# Patient Record
Sex: Female | Born: 1965 | Race: White | Hispanic: No | Smoking: Never smoker
Health system: Southern US, Community
[De-identification: ages and names within clinical notes are randomized; demographics above are authoritative.]

## PROBLEM LIST (undated history)

## (undated) DIAGNOSIS — D649 Anemia, unspecified: Secondary | ICD-10-CM

## (undated) DIAGNOSIS — E538 Deficiency of other specified B group vitamins: Secondary | ICD-10-CM

## (undated) DIAGNOSIS — Z8041 Family history of malignant neoplasm of ovary: Secondary | ICD-10-CM

## (undated) HISTORY — DX: Deficiency of other specified B group vitamins: E53.8

## (undated) HISTORY — DX: Family history of malignant neoplasm of ovary: Z80.41

## (undated) HISTORY — PX: WISDOM TOOTH EXTRACTION: SHX21

## (undated) HISTORY — DX: Anemia, unspecified: D64.9

---

## 2006-01-31 ENCOUNTER — Ambulatory Visit: Payer: Self-pay | Admitting: Unknown Physician Specialty

## 2006-02-03 ENCOUNTER — Ambulatory Visit: Payer: Self-pay | Admitting: Unknown Physician Specialty

## 2006-08-07 ENCOUNTER — Ambulatory Visit: Payer: Self-pay | Admitting: Unknown Physician Specialty

## 2007-02-06 ENCOUNTER — Ambulatory Visit: Payer: Self-pay | Admitting: Unknown Physician Specialty

## 2008-03-04 ENCOUNTER — Ambulatory Visit: Payer: Self-pay | Admitting: Unknown Physician Specialty

## 2008-06-13 HISTORY — PX: BREAST BIOPSY: SHX20

## 2008-10-27 ENCOUNTER — Ambulatory Visit: Payer: Self-pay | Admitting: Unknown Physician Specialty

## 2009-04-16 ENCOUNTER — Ambulatory Visit: Payer: Self-pay | Admitting: General Surgery

## 2009-04-28 ENCOUNTER — Ambulatory Visit: Payer: Self-pay | Admitting: General Surgery

## 2009-06-13 HISTORY — PX: OTHER SURGICAL HISTORY: SHX169

## 2010-04-12 ENCOUNTER — Emergency Department: Payer: Self-pay | Admitting: Emergency Medicine

## 2010-04-13 ENCOUNTER — Ambulatory Visit: Payer: Self-pay | Admitting: Unknown Physician Specialty

## 2010-09-23 ENCOUNTER — Ambulatory Visit: Payer: Self-pay | Admitting: Unknown Physician Specialty

## 2011-01-19 LAB — HM MAMMOGRAPHY: HM Mammogram: NORMAL

## 2011-01-19 LAB — HM PAP SMEAR

## 2011-04-28 ENCOUNTER — Ambulatory Visit: Payer: Self-pay | Admitting: Unknown Physician Specialty

## 2011-11-21 ENCOUNTER — Ambulatory Visit: Payer: Self-pay | Admitting: Internal Medicine

## 2011-12-19 ENCOUNTER — Encounter: Payer: Self-pay | Admitting: Internal Medicine

## 2011-12-19 ENCOUNTER — Other Ambulatory Visit: Payer: Self-pay | Admitting: *Deleted

## 2011-12-19 ENCOUNTER — Ambulatory Visit (INDEPENDENT_AMBULATORY_CARE_PROVIDER_SITE_OTHER): Payer: Self-pay | Admitting: Internal Medicine

## 2011-12-19 VITALS — BP 112/72 | HR 76 | Temp 98.2°F | Ht 70.0 in | Wt 258.5 lb

## 2011-12-19 DIAGNOSIS — Z0000 Encounter for general adult medical examination without abnormal findings: Secondary | ICD-10-CM

## 2011-12-19 DIAGNOSIS — D510 Vitamin B12 deficiency anemia due to intrinsic factor deficiency: Secondary | ICD-10-CM | POA: Insufficient documentation

## 2011-12-19 DIAGNOSIS — Z23 Encounter for immunization: Secondary | ICD-10-CM

## 2011-12-19 DIAGNOSIS — R209 Unspecified disturbances of skin sensation: Secondary | ICD-10-CM

## 2011-12-19 DIAGNOSIS — E669 Obesity, unspecified: Secondary | ICD-10-CM

## 2011-12-19 DIAGNOSIS — R202 Paresthesia of skin: Secondary | ICD-10-CM

## 2011-12-19 DIAGNOSIS — N920 Excessive and frequent menstruation with regular cycle: Secondary | ICD-10-CM

## 2011-12-19 LAB — COMPREHENSIVE METABOLIC PANEL
AST: 20 U/L (ref 0–37)
Albumin: 3.7 g/dL (ref 3.5–5.2)
BUN: 12 mg/dL (ref 6–23)
Calcium: 9 mg/dL (ref 8.4–10.5)
Chloride: 107 mEq/L (ref 96–112)
Glucose, Bld: 95 mg/dL (ref 70–99)
Potassium: 4.1 mEq/L (ref 3.5–5.1)
Total Protein: 7.5 g/dL (ref 6.0–8.3)

## 2011-12-19 LAB — CBC WITH DIFFERENTIAL/PLATELET
Basophils Absolute: 0 10*3/uL (ref 0.0–0.1)
Basophils Relative: 0.5 % (ref 0.0–3.0)
Hemoglobin: 13.8 g/dL (ref 12.0–15.0)
Lymphocytes Relative: 28.6 % (ref 12.0–46.0)
Monocytes Relative: 4.2 % (ref 3.0–12.0)
Neutro Abs: 5.3 10*3/uL (ref 1.4–7.7)
RBC: 4.77 Mil/uL (ref 3.87–5.11)

## 2011-12-19 LAB — LIPID PANEL
LDL Cholesterol: 85 mg/dL (ref 0–99)
Total CHOL/HDL Ratio: 3
Triglycerides: 129 mg/dL (ref 0.0–149.0)

## 2011-12-19 MED ORDER — CYANOCOBALAMIN 1000 MCG/ML IJ SOLN: 1000.0000 ug | INTRAMUSCULAR | Status: DC

## 2011-12-19 MED ORDER — NORETHIN ACE-ETH ESTRAD-FE 1-20 MG-MCG PO TABS: 1.0000 | ORAL_TABLET | Freq: Every day | ORAL | Status: DC

## 2011-12-19 MED ORDER — SYRINGE (DISPOSABLE) 1 ML MISC: Status: DC

## 2011-12-19 NOTE — Assessment & Plan Note (Signed)
Given time frame for this, suspicious that may be related to epidural anesthesia procedure. We discussed setting up referral to neurology for further evaluation likely to include EMG testing. Will set this up.

## 2011-12-19 NOTE — Assessment & Plan Note (Signed)
BMI 37. Encouraged her to continue efforts at healthy diet and regular physical activity, sitting goal of 30 minutes of physical activity 5 days per week. Will check basic lab work including TSH.

## 2011-12-19 NOTE — Progress Notes (Signed)
Subjective:    Patient ID: Jessica Bennett, female    DOB: [birth year], 46 y.o.   MRN: [id number]  HPI 46 year old female with history of menorrhagia presents to establish care. She reports that she is generally feeling well. She notes that she was started on oral contraceptives to help with menorrhagia. She reports no further issues with menorrhagia while on medication.  Her primary concern today is 3 year history of paresthesias over her left lateral thigh. These occur in a very focal area. They first started after she had surgical procedure on her right foot which required epidural anesthesia. The paresthesias on her left lateral thigh has been present since that time. They're unchanged. Occasionally, she has some paresthesia that runs down her left leg. She denies any back pain. She denies any weakness in her left leg. She also notes some mild varicosities in both of her lower extremities but denies any swelling in her legs. She has not had any overlying skin changes at the site of the paresthesias.. These are described as numbness which are not painful.  She also reports concern about her weight. She notes that she has started a walking program with her daughter. She is currently trying to walk about 30 minutes per day. She is also trying to make healthier dietary choices.  Outpatient Encounter Prescriptions as of 12/19/2011  Medication Sig Dispense Refill  . calcium-vitamin D (OSCAL WITH D) 500-200 MG-UNIT per tablet Take 1 tablet by mouth daily.      . Multiple Vitamin (MULTIVITAMIN) tablet Take 1 tablet by mouth daily.      . norethindrone-ethinyl estradiol (JUNEL FE,GILDESS FE,LOESTRIN FE) 1-20 MG-MCG tablet Take 1 tablet by mouth daily.  3 Package  4  . Omega-3 Fatty Acids (FISH OIL) 1200 MG CAPS Take 2 capsules by mouth daily.      Marland Kitchen DISCONTD: norethindrone-ethinyl estradiol (JUNEL FE,GILDESS FE,LOESTRIN FE) 1-20 MG-MCG tablet Take 1 tablet by mouth daily.        Review of Systems    Constitutional: Negative for fever, chills, appetite change, fatigue and unexpected weight change.  HENT: Negative for ear pain, congestion, sore throat, trouble swallowing, neck pain, voice change and sinus pressure.   Eyes: Negative for visual disturbance.  Respiratory: Negative for cough, shortness of breath, wheezing and stridor.   Cardiovascular: Negative for chest pain, palpitations and leg swelling.  Gastrointestinal: Negative for nausea, vomiting, abdominal pain, diarrhea, constipation, blood in stool, abdominal distention and anal bleeding.  Genitourinary: Negative for dysuria and flank pain.  Musculoskeletal: Negative for myalgias, arthralgias and gait problem.  Skin: Negative for color change and rash.  Neurological: Positive for numbness. Negative for dizziness, tremors, syncope, weakness and headaches.  Hematological: Negative for adenopathy. Does not bruise/bleed easily.  Psychiatric/Behavioral: Negative for suicidal ideas, disturbed wake/sleep cycle and dysphoric mood. The patient is not nervous/anxious.    BP 112/72  Pulse 76  Temp 98.2 F (36.8 C) (Oral)  Ht 5\' 10"  (1.778 m)  Wt 258 lb 8 oz (117.255 kg)  BMI 37.09 kg/m2  SpO2 98%  LMP 11/28/2011     Objective:   Physical Exam  Constitutional: She is oriented to person, place, and time. She appears well-developed and well-nourished. No distress.  HENT:  Head: Normocephalic and atraumatic.  Right Ear: External ear normal.  Left Ear: External ear normal.  Nose: Nose normal.  Mouth/Throat: Oropharynx is clear and moist. No oropharyngeal exudate.  Eyes: Conjunctivae are normal. Pupils are equal, round, and reactive to light. Right eye exhibits  no discharge. Left eye exhibits no discharge. No scleral icterus.  Neck: Normal range of motion. Neck supple. No tracheal deviation present. No thyromegaly present.  Cardiovascular: Normal rate, regular rhythm, normal heart sounds and intact distal pulses.  Exam reveals no  gallop and no friction rub.   No murmur heard. Pulmonary/Chest: Effort normal and breath sounds normal. No respiratory distress. She has no wheezes. She has no rales. She exhibits no tenderness.  Abdominal: Soft. Bowel sounds are normal. She exhibits no distension and no mass. There is no tenderness. There is no guarding.  Musculoskeletal: Normal range of motion. She exhibits no edema and no tenderness.  Lymphadenopathy:    She has no cervical adenopathy.  Neurological: She is alert and oriented to person, place, and time. She displays no atrophy. No cranial nerve deficit or sensory deficit. She exhibits normal muscle tone. Coordination and gait normal.  Skin: Skin is warm and dry. No rash noted. She is not diaphoretic. No erythema. No pallor.  Psychiatric: She has a normal mood and affect. Her behavior is normal. Judgment and thought content normal.          Assessment & Plan:

## 2011-12-19 NOTE — Assessment & Plan Note (Signed)
History of pernicious anemia in the past. Will check CBC and B12 level with labs today.

## 2011-12-19 NOTE — Assessment & Plan Note (Signed)
Symptoms well controlled with use of oral contraceptives. Will continue.

## 2012-01-19 ENCOUNTER — Encounter: Payer: Self-pay | Admitting: Internal Medicine

## 2012-01-19 ENCOUNTER — Ambulatory Visit (INDEPENDENT_AMBULATORY_CARE_PROVIDER_SITE_OTHER): Payer: BC Managed Care – PPO | Admitting: Internal Medicine

## 2012-01-19 VITALS — BP 142/72 | HR 80 | Temp 98.1°F | Ht 70.0 in | Wt 260.8 lb

## 2012-01-19 DIAGNOSIS — R209 Unspecified disturbances of skin sensation: Secondary | ICD-10-CM

## 2012-01-19 DIAGNOSIS — E669 Obesity, unspecified: Secondary | ICD-10-CM

## 2012-01-19 DIAGNOSIS — D510 Vitamin B12 deficiency anemia due to intrinsic factor deficiency: Secondary | ICD-10-CM

## 2012-01-19 DIAGNOSIS — R202 Paresthesia of skin: Secondary | ICD-10-CM

## 2012-01-19 MED ORDER — CYANOCOBALAMIN 1000 MCG/ML IJ SOLN
1000.0000 ug | Freq: Once | INTRAMUSCULAR | Status: AC
  Administered 2012-01-19: 1000 ug via INTRAMUSCULAR

## 2012-01-19 NOTE — Assessment & Plan Note (Signed)
No improvement with B12 supplementation. Awaiting neurology evaluation with EMG testing.

## 2012-01-19 NOTE — Assessment & Plan Note (Signed)
Encouraged better compliance with healthy diet and regular physical activity. Followup for physical exam in April 2014.

## 2012-01-19 NOTE — Progress Notes (Signed)
Subjective:    Patient ID: Jessica Bennett, female    DOB: 02/01/1966, 46 y.o.   MRN: [id number]  HPI 46 YO female with history of paresthesias of her left leg and obesity presents for followup. She reports she is generally been feeling well. She has not had any improvement in the paresthesias in her left leg. She notes that neurology evaluation is still pending. On her last visit, she was noted have low B12 level and was given B12 injection. She reports no difference in the paresthesias with this intervention.  In regards to her obesity, she reports some ongoing dietary indiscretion. She does not follow any specific diet or exercise program  Outpatient Encounter Prescriptions as of 01/19/2012  Medication Sig Dispense Refill  . calcium-vitamin D (OSCAL WITH D) 500-200 MG-UNIT per tablet Take 1 tablet by mouth daily.      . cyanocobalamin (,VITAMIN B-12,) 1000 MCG/ML injection Inject 1 mL (1,000 mcg total) into the muscle every 30 (thirty) days.  30 mL  11  . Multiple Vitamin (MULTIVITAMIN) tablet Take 1 tablet by mouth daily.      . norethindrone-ethinyl estradiol (JUNEL FE,GILDESS FE,LOESTRIN FE) 1-20 MG-MCG tablet Take 1 tablet by mouth daily.  3 Package  4  . Omega-3 Fatty Acids (FISH OIL) 1200 MG CAPS Take 2 capsules by mouth daily.      . Syringe, Disposable, 1 ML MISC Use to administer monthly B12 injections  100 each  11   Facility-Administered Encounter Medications as of 01/19/2012  Medication Dose Route Frequency Provider Last Rate Last Dose  . cyanocobalamin ((VITAMIN B-12)) injection 1,000 mcg  1,000 mcg Intramuscular Once Shelia Media, MD   1,000 mcg at 01/19/12 1026    Review of Systems  Constitutional: Negative for fever, chills, appetite change, fatigue and unexpected weight change.  HENT: Negative for ear pain, congestion, sore throat, trouble swallowing, neck pain, voice change and sinus pressure.   Eyes: Negative for visual disturbance.  Respiratory: Negative for cough,  shortness of breath, wheezing and stridor.   Cardiovascular: Negative for chest pain, palpitations and leg swelling.  Gastrointestinal: Negative for nausea, vomiting, abdominal pain, diarrhea, constipation, blood in stool, abdominal distention and anal bleeding.  Genitourinary: Negative for dysuria and flank pain.  Musculoskeletal: Negative for myalgias, arthralgias and gait problem.  Skin: Negative for color change and rash.  Neurological: Positive for numbness. Negative for dizziness and headaches.  Hematological: Negative for adenopathy. Does not bruise/bleed easily.  Psychiatric/Behavioral: Negative for suicidal ideas, disturbed wake/sleep cycle and dysphoric mood. The patient is not nervous/anxious.    BP 142/72  Pulse 80  Temp 98.1 F (36.7 C) (Oral)  Ht 5\' 10"  (1.778 m)  Wt 260 lb 12 oz (118.275 kg)  BMI 37.41 kg/m2  SpO2 98%  LMP 12/29/2011     Objective:   Physical Exam  Constitutional: She is oriented to person, place, and time. She appears well-developed and well-nourished. No distress.  HENT:  Head: Normocephalic and atraumatic.  Right Ear: External ear normal.  Left Ear: External ear normal.  Nose: Nose normal.  Mouth/Throat: Oropharynx is clear and moist. No oropharyngeal exudate.  Eyes: Conjunctivae are normal. Pupils are equal, round, and reactive to light. Right eye exhibits no discharge. Left eye exhibits no discharge. No scleral icterus.  Neck: Normal range of motion. Neck supple. No tracheal deviation present. No thyromegaly present.  Cardiovascular: Normal rate, regular rhythm, normal heart sounds and intact distal pulses.  Exam reveals no gallop and no friction rub.  No murmur heard. Pulmonary/Chest: Effort normal and breath sounds normal. No respiratory distress. She has no wheezes. She has no rales. She exhibits no tenderness.  Musculoskeletal: Normal range of motion. She exhibits no edema and no tenderness.  Lymphadenopathy:    She has no cervical  adenopathy.  Neurological: She is alert and oriented to person, place, and time. No cranial nerve deficit. She exhibits normal muscle tone. Coordination normal.  Skin: Skin is warm and dry. No rash noted. She is not diaphoretic. No erythema. No pallor.  Psychiatric: She has a normal mood and affect. Her behavior is normal. Judgment and thought content normal.          Assessment & Plan:

## 2012-01-19 NOTE — Assessment & Plan Note (Signed)
B12 shot today. We'll plan to give B12 shots monthly.

## 2012-02-27 ENCOUNTER — Encounter: Payer: Self-pay | Admitting: Internal Medicine

## 2012-04-04 ENCOUNTER — Encounter: Payer: Self-pay | Admitting: Internal Medicine

## 2012-04-04 DIAGNOSIS — Z1239 Encounter for other screening for malignant neoplasm of breast: Secondary | ICD-10-CM

## 2012-04-13 ENCOUNTER — Telehealth: Payer: Self-pay | Admitting: Internal Medicine

## 2012-04-13 DIAGNOSIS — Z1239 Encounter for other screening for malignant neoplasm of breast: Secondary | ICD-10-CM

## 2012-04-13 NOTE — Telephone Encounter (Signed)
Order placed

## 2012-04-13 NOTE — Telephone Encounter (Signed)
Pt is calling concerning Mammo. She says she received a letter stating it was time to make her appt and she was wondering if an order could be put in so she could make that appt.

## 2012-04-13 NOTE — Telephone Encounter (Signed)
Jessica Bennett please call patient to set up mammogram, order placed.

## 2012-04-23 ENCOUNTER — Telehealth: Payer: Self-pay | Admitting: Internal Medicine

## 2012-04-23 NOTE — Telephone Encounter (Signed)
Caller: [name]/Patient; Patient Name: Jessica Bennett; PCP: Ronna Polio (Adults only); Best Callback Phone Number: [phone]  Health Hx verified per EPIC. Calling about ongoing discomfort in left upper arm extending to shoulder and elbow. Onset approx. several months Initially seemed to be a localized knot that now seems to be diffuse. Constant sensation - not aware of anything that makes it worse other than laying on it. All emergent sxs per Arm Non-Injury protocol r/o with exception to 'Gradual onset of episodes of pain shooting down any extremity.'

## 2012-04-23 NOTE — Telephone Encounter (Signed)
Appt scheduled with Dr. Lorin Picket 04/24/12 at 0815.

## 2012-04-24 ENCOUNTER — Ambulatory Visit (INDEPENDENT_AMBULATORY_CARE_PROVIDER_SITE_OTHER): Payer: BC Managed Care – PPO | Admitting: Internal Medicine

## 2012-04-24 ENCOUNTER — Encounter: Payer: Self-pay | Admitting: Internal Medicine

## 2012-04-24 ENCOUNTER — Ambulatory Visit (INDEPENDENT_AMBULATORY_CARE_PROVIDER_SITE_OTHER)
Admission: RE | Admit: 2012-04-24 | Discharge: 2012-04-24 | Disposition: A | Discharge status: Home / Self Care | Payer: BC Managed Care – PPO | Source: Ambulatory Visit | Attending: Internal Medicine | Admitting: Internal Medicine

## 2012-04-24 VITALS — BP 117/76 | HR 87 | Temp 98.2°F | Ht 70.0 in | Wt 258.0 lb

## 2012-04-24 DIAGNOSIS — M25519 Pain in unspecified shoulder: Secondary | ICD-10-CM

## 2012-04-24 DIAGNOSIS — M79609 Pain in unspecified limb: Secondary | ICD-10-CM

## 2012-04-24 DIAGNOSIS — M79603 Pain in arm, unspecified: Secondary | ICD-10-CM

## 2012-04-24 NOTE — Patient Instructions (Addendum)
It was nice meeting you today.  I am sorry your arm has been bothering you.  We will check xray as we discussed.  Take tylenol as directed.  Let us know if problems.

## 2012-04-25 ENCOUNTER — Encounter: Payer: Self-pay | Admitting: Internal Medicine

## 2012-04-25 NOTE — Progress Notes (Signed)
  Subjective:    Patient ID: Jessica Bennett, female    DOB: Jan 29, 1966, 46 y.o.   MRN: [id number]  HPI 46 year old female who presents as a work in with concerns regarding persistent arm pain.  States she has noticed the discomfort for several months.  No injury or trauma.  Notices more recently.  She localizes the discomfort to the left upper arm.  Described as uncomfortable.  Not constant.  If she is busy - she does not notice.  Notices at night more.  Not severe pain.  She has recently noticed some discomfort in her shoulder - with full extension and reaching posteriorly.  Does not know if this is related.  No increased swelling, erythema or warmth of the arm.  She is concerned that she has a "tumor or mass" in her arm.  Constantly feeling and pressing on the area.  No pain in the elbow and no pain in the arm with movement.  Not taking anything for pain.     Review of Systems Patient denies any headache, lightheadedness or dizziness.  No chest pain, tightness or palpitations.  No increased shortness of breath.  No breast changes or nodules.   No nausea or vomiting.  No abdominal pain or cramping.  Arm pain as outlined.       Objective:   Physical Exam Filed Vitals:   04/24/12 0816  BP: 117/76  Pulse: 87  Temp: 98.2 F (79.28 C)   46 year old female in no acute distress.  NECK:  Supple, nontender.  No palpable lymphadenopathy HEART:  Appears to be regular. LUNGS:  Without crackles or wheezing audible.  Respirations even and unlabored.   RADIAL PULSE:  Equal bilaterally.  MSK.  Minimal pain to palpation - left upper arm.  No increased swelling or erythema.  No palpable lump or nodule.  Increased pain in the shoulder with full extension of the arm with posterior reaching. No axillary adenopathy appreciated (left).                     Assessment & Plan:  ARM PAIN.  Unclear as to the exact etiology.  Some pain in the shoulder with full extension and reaching posteriorly.  Reassured her  that I did not palpate a mass or nodule.  No increased swelling.  Given present for months, will start with xray of humerus and left shoulder - with detail to surrounding soft tissue.  Have her take scheduled tylenol as directed.  Further w/up pending xray results and response to tylenol.  Pt comfortable with this plan.

## 2012-04-27 ENCOUNTER — Telehealth: Payer: Self-pay | Admitting: *Deleted

## 2012-04-27 NOTE — Telephone Encounter (Signed)
Patient notified. About Xrays.

## 2012-04-30 ENCOUNTER — Ambulatory Visit: Payer: Self-pay | Admitting: Internal Medicine

## 2012-04-30 LAB — HM MAMMOGRAPHY

## 2012-05-09 ENCOUNTER — Encounter: Payer: Self-pay | Admitting: Internal Medicine

## 2012-05-09 ENCOUNTER — Ambulatory Visit (INDEPENDENT_AMBULATORY_CARE_PROVIDER_SITE_OTHER): Payer: BC Managed Care – PPO | Admitting: Internal Medicine

## 2012-05-09 VITALS — BP 116/84 | HR 83 | Temp 98.1°F | Resp 15 | Wt 256.0 lb

## 2012-05-09 DIAGNOSIS — M79622 Pain in left upper arm: Secondary | ICD-10-CM | POA: Insufficient documentation

## 2012-05-09 DIAGNOSIS — M79609 Pain in unspecified limb: Secondary | ICD-10-CM

## 2012-05-09 DIAGNOSIS — D510 Vitamin B12 deficiency anemia due to intrinsic factor deficiency: Secondary | ICD-10-CM

## 2012-05-09 LAB — CBC WITH DIFFERENTIAL/PLATELET
Basophils Absolute: 0 K/uL (ref 0.0–0.1)
Basophils Relative: 0.5 % (ref 0.0–3.0)
Eosinophils Absolute: 0.1 K/uL (ref 0.0–0.7)
Eosinophils Relative: 1.1 % (ref 0.0–5.0)
HCT: 41.2 % (ref 36.0–46.0)
Hemoglobin: 13.5 g/dL (ref 12.0–15.0)
Lymphocytes Relative: 26.9 % (ref 12.0–46.0)
Lymphs Abs: 2.1 K/uL (ref 0.7–4.0)
MCHC: 32.8 g/dL (ref 30.0–36.0)
MCV: 88.1 fl (ref 78.0–100.0)
Monocytes Absolute: 0.4 K/uL (ref 0.1–1.0)
Monocytes Relative: 5.6 % (ref 3.0–12.0)
Neutro Abs: 5.1 K/uL (ref 1.4–7.7)
Neutrophils Relative %: 65.9 % (ref 43.0–77.0)
Platelets: 303 K/uL (ref 150.0–400.0)
RBC: 4.67 Mil/uL (ref 3.87–5.11)
RDW: 12.6 % (ref 11.5–14.6)
WBC: 7.7 K/uL (ref 4.5–10.5)

## 2012-05-09 LAB — COMPREHENSIVE METABOLIC PANEL
ALT: 18 U/L (ref 0–35)
CO2: 26 mEq/L (ref 19–32)
Creatinine, Ser: 0.8 mg/dL (ref 0.4–1.2)
GFR: 83.05 mL/min (ref >60.00)
Total Bilirubin: 0.6 mg/dL (ref 0.3–1.2)

## 2012-05-09 LAB — VITAMIN B12: Vitamin B-12: 489 pg/mL (ref 211–911)

## 2012-05-09 NOTE — Assessment & Plan Note (Addendum)
Symptoms of left upper medial arm pain unclear etiology. Exam is normal. Plain film of left shoulder and humerus were normal. Given pain with abduction of the arm, question if she may have a rotator cuff injury. Will set up orthopedics for evaluation. Question if MRI of the left shoulder or left humerus would be helpful. Followup one month.

## 2012-05-09 NOTE — Progress Notes (Signed)
Subjective:    Patient ID: Jessica Bennett, female    DOB: 09/12/1965, 46 y.o.   MRN: [id number]  HPI 46 year old female presents for acute visit complaining of several week history of left upper arm pain. Pain first began a few weeks ago. It started in the middle of her left upper arm, described as a sharp intermittent pain. She denies any trauma to this area. She denies any swelling or overlying skin changes. Over the last couple weeks the pain has started to occasionally radiate to her left shoulder and left elbow. It is intermittent and does not seem to be affected by movement or exertion. She has noticed some pain with abduction of her left arm and posterior extension of her left arm. She denies weakness in her arm.  At her last visit, she had plain x-rays performed of her left shoulder and left humerus which were normal. She is not currently taking any medication for pain.   Outpatient Encounter Prescriptions as of 05/09/2012  Medication Sig Dispense Refill  . calcium-vitamin D (OSCAL WITH D) 500-200 MG-UNIT per tablet Take 1 tablet by mouth daily.      . cyanocobalamin (,VITAMIN B-12,) 1000 MCG/ML injection Inject 1 mL (1,000 mcg total) into the muscle every 30 (thirty) days.  30 mL  11  . Multiple Vitamin (MULTIVITAMIN) tablet Take 1 tablet by mouth daily.      . norethindrone-ethinyl estradiol (JUNEL FE,GILDESS FE,LOESTRIN FE) 1-20 MG-MCG tablet Take 1 tablet by mouth daily.  3 Package  4  . Omega-3 Fatty Acids (FISH OIL) 1200 MG CAPS Take 2 capsules by mouth daily.      . Syringe, Disposable, 1 ML MISC Use to administer monthly B12 injections  100 each  11   BP 116/84  Pulse 83  Temp 98.1 F (36.7 C) (Oral)  Resp 15  Wt 256 lb (116.121 kg)  SpO2 97%  LMP 04/16/2012  Review of Systems  Constitutional: Negative for fever, chills, appetite change, fatigue and unexpected weight change.  HENT: Negative for ear pain, congestion, sore throat, trouble swallowing, neck pain, voice change  and sinus pressure.   Eyes: Negative for visual disturbance.  Respiratory: Negative for cough, shortness of breath, wheezing and stridor.   Cardiovascular: Negative for chest pain, palpitations and leg swelling.  Gastrointestinal: Negative for nausea, vomiting, abdominal pain, diarrhea, constipation, blood in stool, abdominal distention and anal bleeding.  Genitourinary: Negative for dysuria and flank pain.  Musculoskeletal: Positive for myalgias. Negative for arthralgias and gait problem.  Skin: Negative for color change and rash.  Neurological: Negative for dizziness and headaches.  Hematological: Negative for adenopathy. Does not bruise/bleed easily.  Psychiatric/Behavioral: Negative for suicidal ideas, sleep disturbance and dysphoric mood. The patient is not nervous/anxious.        Objective:   Physical Exam  Constitutional: She is oriented to person, place, and time. She appears well-developed and well-nourished. No distress.  HENT:  Head: Normocephalic and atraumatic.  Right Ear: External ear normal.  Left Ear: External ear normal.  Nose: Nose normal.  Mouth/Throat: Oropharynx is clear and moist. No oropharyngeal exudate.  Eyes: Conjunctivae normal are normal. Pupils are equal, round, and reactive to light. Right eye exhibits no discharge. Left eye exhibits no discharge. No scleral icterus.  Neck: Normal range of motion. Neck supple. No tracheal deviation present. No thyromegaly present.  Pulmonary/Chest: Effort normal.  Musculoskeletal: Normal range of motion. She exhibits no edema and no tenderness.       Right shoulder: She exhibits  pain. She exhibits normal range of motion, no tenderness and normal strength.       Arms: Lymphadenopathy:    She has no cervical adenopathy.  Neurological: She is alert and oriented to person, place, and time. No cranial nerve deficit. She exhibits normal muscle tone. Coordination normal.  Skin: Skin is warm and dry. No rash noted. She is not  diaphoretic. No erythema. No pallor.  Psychiatric: She has a normal mood and affect. Her behavior is normal. Judgment and thought content normal.          Assessment & Plan:

## 2012-05-14 ENCOUNTER — Encounter: Payer: Self-pay | Admitting: Internal Medicine

## 2012-05-24 ENCOUNTER — Telehealth: Payer: Self-pay

## 2012-05-24 NOTE — Telephone Encounter (Signed)
Pt request CD of recent xrays ordered by Dr Dan Humphreys to be picked up on 05/28/12. Pt to see orthopedic. Pt request call back.

## 2012-06-08 ENCOUNTER — Ambulatory Visit: Payer: BC Managed Care – PPO | Admitting: Internal Medicine

## 2012-07-25 ENCOUNTER — Ambulatory Visit: Payer: Self-pay | Admitting: Unknown Physician Specialty

## 2012-10-22 ENCOUNTER — Ambulatory Visit (INDEPENDENT_AMBULATORY_CARE_PROVIDER_SITE_OTHER): Payer: BC Managed Care – PPO | Admitting: Internal Medicine

## 2012-10-22 ENCOUNTER — Encounter: Payer: Self-pay | Admitting: Internal Medicine

## 2012-10-22 ENCOUNTER — Other Ambulatory Visit (HOSPITAL_COMMUNITY)
Admission: RE | Admit: 2012-10-22 | Discharge: 2012-10-22 | Disposition: A | Discharge status: Home / Self Care | Payer: BC Managed Care – PPO | Source: Ambulatory Visit | Attending: Internal Medicine | Admitting: Internal Medicine

## 2012-10-22 VITALS — BP 116/80 | HR 76 | Temp 98.7°F | Ht 70.25 in | Wt 253.0 lb

## 2012-10-22 DIAGNOSIS — N920 Excessive and frequent menstruation with regular cycle: Secondary | ICD-10-CM

## 2012-10-22 DIAGNOSIS — Z1151 Encounter for screening for human papillomavirus (HPV): Secondary | ICD-10-CM | POA: Insufficient documentation

## 2012-10-22 DIAGNOSIS — Z01419 Encounter for gynecological examination (general) (routine) without abnormal findings: Secondary | ICD-10-CM | POA: Insufficient documentation

## 2012-10-22 DIAGNOSIS — E669 Obesity, unspecified: Secondary | ICD-10-CM

## 2012-10-22 DIAGNOSIS — Z0000 Encounter for general adult medical examination without abnormal findings: Secondary | ICD-10-CM

## 2012-10-22 LAB — COMPREHENSIVE METABOLIC PANEL
ALT: 20 U/L (ref 0–35)
AST: 21 U/L (ref 0–37)
Albumin: 3.6 g/dL (ref 3.5–5.2)
Alkaline Phosphatase: 47 U/L (ref 39–117)
BUN: 12 mg/dL (ref 6–23)
Potassium: 4 mEq/L (ref 3.5–5.1)

## 2012-10-22 LAB — HM PAP SMEAR: HM Pap smear: NEGATIVE

## 2012-10-22 LAB — CBC WITH DIFFERENTIAL/PLATELET
Basophils Relative: 0.3 % (ref 0.0–3.0)
Eosinophils Absolute: 0.1 10*3/uL (ref 0.0–0.7)
Hemoglobin: 13.5 g/dL (ref 12.0–15.0)
MCHC: 34.4 g/dL (ref 30.0–36.0)
MCV: 86.1 fl (ref 78.0–100.0)
Monocytes Absolute: 0.4 10*3/uL (ref 0.1–1.0)
Neutro Abs: 4.8 10*3/uL (ref 1.4–7.7)
Neutrophils Relative %: 66.8 % (ref 43.0–77.0)
RBC: 4.55 Mil/uL (ref 3.87–5.11)
RDW: 12.7 % (ref 11.5–14.6)

## 2012-10-22 LAB — LIPID PANEL
LDL Cholesterol: 91 mg/dL (ref 0–99)
VLDL: 21.6 mg/dL (ref 0.0–40.0)

## 2012-10-22 MED ORDER — NORETHIN ACE-ETH ESTRAD-FE 1-20 MG-MCG PO TABS: 1.0000 | ORAL_TABLET | Freq: Every day | ORAL | Status: DC

## 2012-10-22 MED ORDER — CYANOCOBALAMIN 1000 MCG/ML IJ SOLN: 1000.0000 ug | INTRAMUSCULAR | Status: DC

## 2012-10-22 NOTE — Assessment & Plan Note (Signed)
Wt Readings from Last 3 Encounters:  10/22/12 253 lb (114.76 kg)  05/09/12 256 lb (116.121 kg)  04/24/12 258 lb (117.028 kg)   Encouraged continued efforts at healthy diet and regular physical activity.

## 2012-10-22 NOTE — Assessment & Plan Note (Signed)
General medical exam including breast and pelvic exam normal today. PAP is pending. Mammogram is up-to-date. Will check labs today including CBC, CMP, lipid profile, TSH, vitamin D. Encouraged continued efforts at healthy diet and regular physical activity.

## 2012-10-22 NOTE — Progress Notes (Signed)
Subjective:    Patient ID: Jessica Bennett, female    DOB: [birth year], 47 y.o.   MRN: [id number]  HPI 47 year old female presents for annual exam. She reports she is generally feeling well. No new concerns today. Tries to follow a healthy diet and has increased her physical activity with walking.  Outpatient Encounter Prescriptions as of 10/22/2012  Medication Sig Dispense Refill  . calcium-vitamin D (OSCAL WITH D) 500-200 MG-UNIT per tablet Take 1 tablet by mouth daily.      . cyanocobalamin (,VITAMIN B-12,) 1000 MCG/ML injection Inject 1 mL (1,000 mcg total) into the muscle every 30 (thirty) days.  30 mL  11  . Multiple Vitamin (MULTIVITAMIN) tablet Take 1 tablet by mouth daily.      . norethindrone-ethinyl estradiol (JUNEL FE,GILDESS FE,LOESTRIN FE) 1-20 MG-MCG tablet Take 1 tablet by mouth daily.  3 Package  4  . Omega-3 Fatty Acids (FISH OIL) 1200 MG CAPS Take 2 capsules by mouth daily.      . Syringe, Disposable, 1 ML MISC Use to administer monthly B12 injections  100 each  11   No facility-administered encounter medications on file as of 10/22/2012.   BP 116/80  Pulse 76  Temp(Src) 98.7 F (37.1 C) (Oral)  Ht 5' 10.25" (1.784 m)  Wt 253 lb (114.76 kg)  BMI 36.06 kg/m2  SpO2 97%  LMP 10/08/2012  Review of Systems  Constitutional: Negative for fever, chills, appetite change, fatigue and unexpected weight change.  HENT: Negative for ear pain, congestion, sore throat, trouble swallowing, neck pain, voice change and sinus pressure.   Eyes: Negative for visual disturbance.  Respiratory: Negative for cough, shortness of breath, wheezing and stridor.   Cardiovascular: Negative for chest pain, palpitations and leg swelling.  Gastrointestinal: Negative for nausea, vomiting, abdominal pain, diarrhea, constipation, blood in stool, abdominal distention and anal bleeding.  Genitourinary: Negative for dysuria and flank pain.  Musculoskeletal: Negative for myalgias, arthralgias and gait  problem.  Skin: Negative for color change and rash.  Neurological: Negative for dizziness and headaches.  Hematological: Negative for adenopathy. Does not bruise/bleed easily.  Psychiatric/Behavioral: Negative for suicidal ideas, sleep disturbance and dysphoric mood. The patient is not nervous/anxious.        Objective:   Physical Exam  Constitutional: She is oriented to person, place, and time. She appears well-developed and well-nourished. No distress.  HENT:  Head: Normocephalic and atraumatic.  Right Ear: External ear normal.  Left Ear: External ear normal.  Nose: Nose normal.  Mouth/Throat: Oropharynx is clear and moist. No oropharyngeal exudate.  Eyes: Conjunctivae are normal. Pupils are equal, round, and reactive to light. Right eye exhibits no discharge. Left eye exhibits no discharge. No scleral icterus.  Neck: Normal range of motion. Neck supple. No tracheal deviation present. No thyromegaly present.  Cardiovascular: Normal rate, regular rhythm, normal heart sounds and intact distal pulses.  Exam reveals no gallop and no friction rub.   No murmur heard. Pulmonary/Chest: Effort normal and breath sounds normal. No accessory muscle usage. Not tachypneic. No respiratory distress. She has no decreased breath sounds. She has no wheezes. She has no rhonchi. She has no rales. She exhibits no tenderness.  Abdominal: Soft. Bowel sounds are normal. She exhibits no distension and no mass. There is no tenderness. There is no rebound and no guarding.  Genitourinary: Rectum normal, vagina normal and uterus normal. No breast swelling, tenderness, discharge or bleeding. Pelvic exam was performed with patient supine. There is no rash, tenderness or lesion on the  right labia. There is no rash, tenderness or lesion on the left labia. Uterus is not enlarged and not tender. Cervix exhibits no motion tenderness, no discharge and no friability. Right adnexum displays no mass, no tenderness and no fullness.  Left adnexum displays no mass, no tenderness and no fullness. No erythema or tenderness around the vagina. No vaginal discharge found.  Musculoskeletal: Normal range of motion. She exhibits no edema and no tenderness.  Lymphadenopathy:    She has no cervical adenopathy.  Neurological: She is alert and oriented to person, place, and time. No cranial nerve deficit. She exhibits normal muscle tone. Coordination normal.  Skin: Skin is warm and dry. No rash noted. She is not diaphoretic. No erythema. No pallor.  Psychiatric: She has a normal mood and affect. Her behavior is normal. Judgment and thought content normal.          Assessment & Plan:

## 2012-10-23 LAB — VITAMIN D 25 HYDROXY (VIT D DEFICIENCY, FRACTURES): Vit D, 25-Hydroxy: 51 ng/mL (ref 30–89)

## 2013-04-09 ENCOUNTER — Encounter: Payer: Self-pay | Admitting: Internal Medicine

## 2013-04-18 ENCOUNTER — Other Ambulatory Visit: Payer: Self-pay

## 2013-04-18 LAB — HM MAMMOGRAPHY: HM Mammogram: NORMAL

## 2013-05-01 ENCOUNTER — Ambulatory Visit: Payer: Self-pay | Admitting: Internal Medicine

## 2013-05-22 ENCOUNTER — Encounter: Payer: Self-pay | Admitting: Internal Medicine

## 2013-09-09 ENCOUNTER — Encounter: Payer: Self-pay | Admitting: Internal Medicine

## 2013-10-24 ENCOUNTER — Encounter: Payer: BC Managed Care – PPO | Admitting: Internal Medicine

## 2013-10-31 ENCOUNTER — Telehealth: Payer: Self-pay | Admitting: Internal Medicine

## 2013-10-31 DIAGNOSIS — N920 Excessive and frequent menstruation with regular cycle: Secondary | ICD-10-CM

## 2013-10-31 NOTE — Telephone Encounter (Signed)
Pt had to rs cpe due to testing at work.  New appt 7/6.  States her scripts will run out 6/21.  Asking if she can get refill to get her through to cpe.

## 2013-11-01 MED ORDER — NORETHIN ACE-ETH ESTRAD-FE 1-20 MG-MCG PO TABS: 1.0000 | ORAL_TABLET | Freq: Every day | ORAL | Status: DC

## 2013-11-01 NOTE — Telephone Encounter (Signed)
Pt states she just needs her birth control refilled until her appt, to [facility]. Rx sent to pharmacy by escript, pt notified.

## 2013-11-06 ENCOUNTER — Encounter: Payer: BC Managed Care – PPO | Admitting: Internal Medicine

## 2013-11-29 ENCOUNTER — Other Ambulatory Visit: Payer: Self-pay | Admitting: *Deleted

## 2013-11-29 MED ORDER — NORETHIN ACE-ETH ESTRAD-FE 1-20 MG-MCG PO TABS
1.0000 | ORAL_TABLET | Freq: Every day | ORAL | Status: DC
Start: 2013-11-29 — End: 2013-12-16

## 2013-12-16 ENCOUNTER — Encounter: Payer: Self-pay | Admitting: Internal Medicine

## 2013-12-16 ENCOUNTER — Ambulatory Visit (INDEPENDENT_AMBULATORY_CARE_PROVIDER_SITE_OTHER): Payer: BC Managed Care – PPO | Admitting: Internal Medicine

## 2013-12-16 VITALS — BP 126/76 | HR 88 | Temp 98.2°F | Ht 70.25 in | Wt 263.8 lb

## 2013-12-16 DIAGNOSIS — Z0000 Encounter for general adult medical examination without abnormal findings: Secondary | ICD-10-CM

## 2013-12-16 DIAGNOSIS — E669 Obesity, unspecified: Secondary | ICD-10-CM

## 2013-12-16 LAB — HEMOGLOBIN A1C: Hgb A1c MFr Bld: 5.5 % (ref 4.6–6.5)

## 2013-12-16 LAB — COMPREHENSIVE METABOLIC PANEL
ALK PHOS: 47 U/L (ref 39–117)
ALT: 20 U/L (ref 0–35)
AST: 22 U/L (ref 0–37)
Albumin: 3.7 g/dL (ref 3.5–5.2)
BILIRUBIN TOTAL: 0.4 mg/dL (ref 0.2–1.2)
BUN: 11 mg/dL (ref 6–23)
CO2: 24 mEq/L (ref 19–32)
Calcium: 9.2 mg/dL (ref 8.4–10.5)
Chloride: 106 mEq/L (ref 96–112)
Creatinine, Ser: 0.9 mg/dL (ref 0.4–1.2)
GFR: 71.88 mL/min (ref >60.00)
Glucose, Bld: 108 mg/dL — ABNORMAL HIGH (ref 70–99)
Potassium: 4 mEq/L (ref 3.5–5.1)
SODIUM: 139 meq/L (ref 135–145)
TOTAL PROTEIN: 6.8 g/dL (ref 6.0–8.3)

## 2013-12-16 LAB — VITAMIN B12: VITAMIN B 12: 312 pg/mL (ref 211–911)

## 2013-12-16 LAB — CBC WITH DIFFERENTIAL/PLATELET
BASOS ABS: 0 10*3/uL (ref 0.0–0.1)
Basophils Relative: 0.6 % (ref 0.0–3.0)
Eosinophils Absolute: 0.1 10*3/uL (ref 0.0–0.7)
Eosinophils Relative: 1.3 % (ref 0.0–5.0)
HEMATOCRIT: 39.5 % (ref 36.0–46.0)
Hemoglobin: 13.4 g/dL (ref 12.0–15.0)
LYMPHS ABS: 1.8 10*3/uL (ref 0.7–4.0)
Lymphocytes Relative: 22.8 % (ref 12.0–46.0)
MCHC: 33.9 g/dL (ref 30.0–36.0)
MCV: 86.3 fl (ref 78.0–100.0)
MONO ABS: 0.3 10*3/uL (ref 0.1–1.0)
Monocytes Relative: 3.9 % (ref 3.0–12.0)
Neutro Abs: 5.5 10*3/uL (ref 1.4–7.7)
Neutrophils Relative %: 71.4 % (ref 43.0–77.0)
PLATELETS: 320 10*3/uL (ref 150.0–400.0)
RBC: 4.58 Mil/uL (ref 3.87–5.11)
RDW: 12.8 % (ref 11.5–15.5)
WBC: 7.7 10*3/uL (ref 4.0–10.5)

## 2013-12-16 LAB — MICROALBUMIN / CREATININE URINE RATIO
Creatinine,U: 324.7 mg/dL
Microalb Creat Ratio: 0.3 mg/g (ref 0.0–30.0)
Microalb, Ur: 1 mg/dL (ref 0.0–1.9)

## 2013-12-16 LAB — LIPID PANEL
CHOLESTEROL: 162 mg/dL (ref 0–200)
HDL: 44.8 mg/dL (ref >39.00)
LDL CALC: 94 mg/dL (ref 0–99)
NONHDL: 117.2
Total CHOL/HDL Ratio: 4
Triglycerides: 118 mg/dL (ref 0.0–149.0)
VLDL: 23.6 mg/dL (ref 0.0–40.0)

## 2013-12-16 LAB — TSH: TSH: 1.46 u[IU]/mL (ref 0.35–4.50)

## 2013-12-16 LAB — VITAMIN D 25 HYDROXY (VIT D DEFICIENCY, FRACTURES): VITD: 37.34 ng/mL

## 2013-12-16 MED ORDER — NORETHIN ACE-ETH ESTRAD-FE 1-20 MG-MCG PO TABS: 1.0000 | ORAL_TABLET | Freq: Every day | ORAL | Status: DC

## 2013-12-16 MED ORDER — CYANOCOBALAMIN 1000 MCG/ML IJ SOLN: 1000.0000 ug | INTRAMUSCULAR | Status: DC

## 2013-12-16 NOTE — Patient Instructions (Signed)
It was nice to see you today.  You are doing well.  Continue healthy diet and set a goal of exercising 40 minutes three times per week.

## 2013-12-16 NOTE — Progress Notes (Signed)
Subjective:    Patient ID: Jessica Bennett, female    DOB: 22-Jan-1966, 48 y.o.   MRN: [id number]  HPI 48YO female presents for annual exam. Doing well. No concerns today. Trying to follow a healthy diet and exercise.   Review of Systems  Constitutional: Negative for fever, chills, appetite change, fatigue and unexpected weight change.  Eyes: Negative for visual disturbance.  Respiratory: Negative for shortness of breath.   Cardiovascular: Negative for chest pain and leg swelling.  Gastrointestinal: Negative for nausea, vomiting, abdominal pain, diarrhea, constipation and blood in stool.  Musculoskeletal: Negative for arthralgias and myalgias.  Skin: Negative for color change and rash.  Neurological: Negative for weakness.  Hematological: Negative for adenopathy. Does not bruise/bleed easily.  Psychiatric/Behavioral: Negative for dysphoric mood. The patient is not nervous/anxious.        Objective:    BP 126/76  Pulse 88  Temp(Src) 98.2 F (36.8 C) (Oral)  Ht 5' 10.25" (1.784 m)  Wt 263 lb 12 oz (119.636 kg)  BMI 37.59 kg/m2  SpO2 98%  LMP 11/25/2013 Physical Exam  Constitutional: She is oriented to person, place, and time. She appears well-developed and well-nourished. No distress.  HENT:  Head: Normocephalic and atraumatic.  Right Ear: External ear normal.  Left Ear: External ear normal.  Nose: Nose normal.  Mouth/Throat: Oropharynx is clear and moist. No oropharyngeal exudate.  Eyes: Conjunctivae are normal. Pupils are equal, round, and reactive to light. Right eye exhibits no discharge. Left eye exhibits no discharge. No scleral icterus.  Neck: Normal range of motion. Neck supple. No tracheal deviation present. No thyromegaly present.  Cardiovascular: Normal rate, regular rhythm, normal heart sounds and intact distal pulses.  Exam reveals no gallop and no friction rub.   No murmur heard. Pulmonary/Chest: Effort normal and breath sounds normal. No accessory muscle  usage. Not tachypneic. No respiratory distress. She has no decreased breath sounds. She has no wheezes. She has no rales. She exhibits no tenderness. Right breast exhibits no inverted nipple, no mass, no nipple discharge, no skin change and no tenderness. Left breast exhibits no inverted nipple, no mass, no nipple discharge, no skin change and no tenderness. Breasts are symmetrical.  Abdominal: Soft. Bowel sounds are normal. She exhibits no distension and no mass. There is no tenderness. There is no rebound and no guarding.  Musculoskeletal: Normal range of motion. She exhibits no edema and no tenderness.  Lymphadenopathy:    She has no cervical adenopathy.  Neurological: She is alert and oriented to person, place, and time. No cranial nerve deficit. She exhibits normal muscle tone. Coordination normal.  Skin: Skin is warm and dry. No rash noted. She is not diaphoretic. No erythema. No pallor.  Psychiatric: She has a normal mood and affect. Her behavior is normal. Judgment and thought content normal.          Assessment & Plan:   Problem List Items Addressed This Visit     Unprioritized   Obesity       Wt Readings from Last 3 Encounters:  12/16/13 263 lb 12 oz (119.636 kg)  10/22/12 253 lb (114.76 kg)  05/09/12 256 lb (116.121 kg)   Body mass index is 37.59 kg/(m^2). Encouraged healthy diet and exercise with goal of weight loss. Will check TSH and A1c today.    Routine general medical examination at a health care facility - Primary     General medical exam normal today including breast exam. PAP and pelvic deferred as PAP  normal 2014. Mammogram ordered. Labs today including CBC, CMP, lipids, TSH, A1c. Encouraged healthy diet and exercise.    Relevant Orders      CBC with Differential      Comprehensive metabolic panel      Lipid panel      Microalbumin / creatinine urine ratio      Vit D  25 hydroxy (rtn osteoporosis monitoring)      TSH      Hemoglobin A1c      B12        Return in about 1 year (around 12/17/2014) for Physical.

## 2013-12-16 NOTE — Progress Notes (Signed)
Pre visit review using our clinic review tool, if applicable. No additional management support is needed unless otherwise documented below in the visit note. 

## 2013-12-16 NOTE — Assessment & Plan Note (Signed)
  Wt Readings from Last 3 Encounters:  12/16/13 263 lb 12 oz (119.636 kg)  10/22/12 253 lb (114.76 kg)  05/09/12 256 lb (116.121 kg)   Body mass index is 37.59 kg/(m^2). Encouraged healthy diet and exercise with goal of weight loss. Will check TSH and A1c today.

## 2013-12-16 NOTE — Assessment & Plan Note (Signed)
General medical exam normal today including breast exam. PAP and pelvic deferred as PAP normal 2014. Mammogram ordered. Labs today including CBC, CMP, lipids, TSH, A1c. Encouraged healthy diet and exercise.

## 2014-05-07 ENCOUNTER — Ambulatory Visit: Payer: Self-pay | Admitting: Internal Medicine

## 2014-05-07 LAB — HM MAMMOGRAPHY: HM Mammogram: NEGATIVE

## 2014-05-12 ENCOUNTER — Encounter: Payer: Self-pay | Admitting: *Deleted

## 2014-05-21 ENCOUNTER — Encounter: Payer: Self-pay | Admitting: Internal Medicine

## 2015-01-14 ENCOUNTER — Encounter: Payer: Self-pay | Admitting: Internal Medicine

## 2015-01-14 ENCOUNTER — Ambulatory Visit (INDEPENDENT_AMBULATORY_CARE_PROVIDER_SITE_OTHER): Payer: BC Managed Care – PPO | Admitting: Internal Medicine

## 2015-01-14 VITALS — BP 95/68 | HR 90 | Temp 98.0°F | Ht 70.25 in | Wt 229.5 lb

## 2015-01-14 DIAGNOSIS — E669 Obesity, unspecified: Secondary | ICD-10-CM | POA: Diagnosis not present

## 2015-01-14 DIAGNOSIS — Z0000 Encounter for general adult medical examination without abnormal findings: Secondary | ICD-10-CM

## 2015-01-14 LAB — COMPREHENSIVE METABOLIC PANEL
ALT: 20 U/L (ref 0–35)
AST: 20 U/L (ref 0–37)
Albumin: 4.2 g/dL (ref 3.5–5.2)
Alkaline Phosphatase: 46 U/L (ref 39–117)
BILIRUBIN TOTAL: 0.4 mg/dL (ref 0.2–1.2)
BUN: 13 mg/dL (ref 6–23)
CALCIUM: 9.2 mg/dL (ref 8.4–10.5)
CO2: 23 mEq/L (ref 19–32)
CREATININE: 0.86 mg/dL (ref 0.40–1.20)
Chloride: 105 mEq/L (ref 96–112)
GFR: 74.45 mL/min (ref >60.00)
Glucose, Bld: 86 mg/dL (ref 70–99)
POTASSIUM: 4.2 meq/L (ref 3.5–5.1)
SODIUM: 139 meq/L (ref 135–145)
TOTAL PROTEIN: 6.9 g/dL (ref 6.0–8.3)

## 2015-01-14 LAB — CBC WITH DIFFERENTIAL/PLATELET
BASOS ABS: 0 10*3/uL (ref 0.0–0.1)
Basophils Relative: 0.5 % (ref 0.0–3.0)
Eosinophils Absolute: 0.1 10*3/uL (ref 0.0–0.7)
Eosinophils Relative: 1.3 % (ref 0.0–5.0)
HCT: 41.5 % (ref 36.0–46.0)
HEMOGLOBIN: 14 g/dL (ref 12.0–15.0)
LYMPHS PCT: 30.4 % (ref 12.0–46.0)
Lymphs Abs: 1.9 10*3/uL (ref 0.7–4.0)
MCHC: 33.8 g/dL (ref 30.0–36.0)
MCV: 87.3 fl (ref 78.0–100.0)
MONOS PCT: 4 % (ref 3.0–12.0)
Monocytes Absolute: 0.3 10*3/uL (ref 0.1–1.0)
NEUTROS ABS: 4.1 10*3/uL (ref 1.4–7.7)
Neutrophils Relative %: 63.8 % (ref 43.0–77.0)
PLATELETS: 291 10*3/uL (ref 150.0–400.0)
RBC: 4.75 Mil/uL (ref 3.87–5.11)
RDW: 12.4 % (ref 11.5–15.5)
WBC: 6.4 10*3/uL (ref 4.0–10.5)

## 2015-01-14 LAB — LIPID PANEL
Cholesterol: 149 mg/dL (ref 0–200)
HDL: 41.1 mg/dL (ref >39.00)
LDL CALC: 83 mg/dL (ref 0–99)
NONHDL: 107.94
Total CHOL/HDL Ratio: 4
Triglycerides: 127 mg/dL (ref 0.0–149.0)
VLDL: 25.4 mg/dL (ref 0.0–40.0)

## 2015-01-14 LAB — HEMOGLOBIN A1C: Hgb A1c MFr Bld: 5.3 % (ref 4.6–6.5)

## 2015-01-14 LAB — TSH: TSH: 1.51 u[IU]/mL (ref 0.35–4.50)

## 2015-01-14 LAB — VITAMIN D 25 HYDROXY (VIT D DEFICIENCY, FRACTURES): VITD: 34.24 ng/mL (ref 30.00–100.00)

## 2015-01-14 LAB — VITAMIN B12: Vitamin B-12: 345 pg/mL (ref 211–911)

## 2015-01-14 MED ORDER — SYRINGE (DISPOSABLE) 1 ML MISC: Status: DC

## 2015-01-14 MED ORDER — CYANOCOBALAMIN 1000 MCG/ML IJ SOLN: 1000.0000 ug | INTRAMUSCULAR | Status: DC

## 2015-01-14 MED ORDER — NORETHIN ACE-ETH ESTRAD-FE 1-20 MG-MCG PO TABS: 1.0000 | ORAL_TABLET | Freq: Every day | ORAL | Status: DC

## 2015-01-14 NOTE — Progress Notes (Signed)
Pre visit review using our clinic review tool, if applicable. No additional management support is needed unless otherwise documented below in the visit note. 

## 2015-01-14 NOTE — Patient Instructions (Signed)

## 2015-01-14 NOTE — Assessment & Plan Note (Signed)
Wt Readings from Last 3 Encounters:  01/14/15 229 lb 8 oz (104.101 kg)  12/16/13 263 lb 12 oz (119.636 kg)  10/22/12 253 lb (114.76 kg)   Body mass index is 32.71 kg/(m^2). Congratulated pt on weight loss. Encouraged healthy diet and exercise.

## 2015-01-14 NOTE — Addendum Note (Signed)
Addended by: Nanci Pina on: 01/14/2015 01:48 PM   Modules accepted: Miquel Dunn

## 2015-01-14 NOTE — Progress Notes (Signed)
Subjective:    Patient ID: Jessica Bennett, female    DOB: [birth year], 49 y.o.   MRN: [id number]  HPI  49YO female presents for annual exam.  Following a pescetarian diet and started yoga. Notes huge improvement in stress. Feeling well. No concerns today.  Wt Readings from Last 3 Encounters:  01/14/15 229 lb 8 oz (104.101 kg)  12/16/13 263 lb 12 oz (119.636 kg)  10/22/12 253 lb (114.76 kg)     Past medical, surgical, family and social history per today's encounter.   Review of Systems  Constitutional: Negative for fever, chills, appetite change, fatigue and unexpected weight change.  Eyes: Negative for visual disturbance.  Respiratory: Negative for shortness of breath.   Cardiovascular: Negative for chest pain and leg swelling.  Gastrointestinal: Negative for nausea, vomiting, abdominal pain, diarrhea and constipation.  Musculoskeletal: Negative for myalgias and arthralgias.  Skin: Negative for color change and rash.  Hematological: Negative for adenopathy. Does not bruise/bleed easily.  Psychiatric/Behavioral: Negative for sleep disturbance and dysphoric mood. The patient is not nervous/anxious.        Objective:    BP 95/68 mmHg  Pulse 90  Temp(Src) 98 F (36.7 C) (Oral)  Ht 5' 10.25" (1.784 m)  Wt 229 lb 8 oz (104.101 kg)  BMI 32.71 kg/m2  SpO2 100%  LMP 12/22/2014 (Approximate) Physical Exam  Constitutional: She is oriented to person, place, and time. She appears well-developed and well-nourished. No distress.  HENT:  Head: Normocephalic and atraumatic.  Right Ear: External ear normal.  Left Ear: External ear normal.  Nose: Nose normal.  Mouth/Throat: Oropharynx is clear and moist. No oropharyngeal exudate.  Eyes: Conjunctivae are normal. Pupils are equal, round, and reactive to light. Right eye exhibits no discharge. Left eye exhibits no discharge. No scleral icterus.  Neck: Normal range of motion. Neck supple. No tracheal deviation present. No  thyromegaly present.  Cardiovascular: Normal rate, regular rhythm, normal heart sounds and intact distal pulses.  Exam reveals no gallop and no friction rub.   No murmur heard. Pulmonary/Chest: Effort normal and breath sounds normal. No accessory muscle usage. No tachypnea. No respiratory distress. She has no decreased breath sounds. She has no wheezes. She has no rales. She exhibits no tenderness. Right breast exhibits no inverted nipple, no mass, no nipple discharge, no skin change and no tenderness. Left breast exhibits no inverted nipple, no mass, no nipple discharge, no skin change and no tenderness. Breasts are symmetrical.  Abdominal: Soft. Bowel sounds are normal. She exhibits no distension and no mass. There is no tenderness. There is no rebound and no guarding.  Musculoskeletal: Normal range of motion. She exhibits no edema or tenderness.  Lymphadenopathy:    She has no cervical adenopathy.  Neurological: She is alert and oriented to person, place, and time. No cranial nerve deficit. She exhibits normal muscle tone. Coordination normal.  Skin: Skin is warm and dry. No rash noted. She is not diaphoretic. No erythema. No pallor.  Psychiatric: She has a normal mood and affect. Her behavior is normal. Judgment and thought content normal.          Assessment & Plan:   Problem List Items Addressed This Visit      Unprioritized   Obesity    Wt Readings from Last 3 Encounters:  01/14/15 229 lb 8 oz (104.101 kg)  12/16/13 263 lb 12 oz (119.636 kg)  10/22/12 253 lb (114.76 kg)   Body mass index is 32.71 kg/(m^2). Congratulated pt on  weight loss. Encouraged healthy diet and exercise.      Routine general medical examination at a health care facility - Primary    General medical exam including breast exam normal today. PAP and pelvic deferred as PAP normal HPV neg 2014, plan repeat in 2017. Mammogram scheduled. Labs as ordered. Encouraged continued healthy diet and exercise.  Immunizations are UTD.      Relevant Orders   MM Digital Screening   Comprehensive metabolic panel   Hemoglobin A1c   CBC with Differential/Platelet   Lipid panel   Vit D  25 hydroxy (rtn osteoporosis monitoring)   TSH   B12       Return in about 1 year (around 01/14/2016) for Physical.

## 2015-01-14 NOTE — Assessment & Plan Note (Signed)
General medical exam including breast exam normal today. PAP and pelvic deferred as PAP normal HPV neg 2014, plan repeat in 2017. Mammogram scheduled. Labs as ordered. Encouraged continued healthy diet and exercise. Immunizations are UTD.

## 2015-05-12 ENCOUNTER — Ambulatory Visit: Payer: BC Managed Care – PPO

## 2015-05-21 ENCOUNTER — Ambulatory Visit
Admission: RE | Admit: 2015-05-21 | Discharge: 2015-05-21 | Disposition: A | Discharge status: Home / Self Care | Payer: BC Managed Care – PPO | Source: Ambulatory Visit | Attending: Internal Medicine | Admitting: Internal Medicine

## 2015-05-21 DIAGNOSIS — Z0000 Encounter for general adult medical examination without abnormal findings: Secondary | ICD-10-CM

## 2015-05-21 DIAGNOSIS — Z1231 Encounter for screening mammogram for malignant neoplasm of breast: Secondary | ICD-10-CM | POA: Diagnosis present

## 2015-12-28 ENCOUNTER — Encounter: Payer: Self-pay | Admitting: Internal Medicine

## 2016-01-18 ENCOUNTER — Encounter: Payer: BC Managed Care – PPO | Admitting: Internal Medicine

## 2016-03-03 ENCOUNTER — Telehealth: Payer: Self-pay | Admitting: Internal Medicine

## 2016-03-17 ENCOUNTER — Encounter: Payer: Self-pay | Admitting: Family Medicine

## 2016-03-17 ENCOUNTER — Other Ambulatory Visit (HOSPITAL_COMMUNITY)
Admission: RE | Admit: 2016-03-17 | Discharge: 2016-03-17 | Disposition: A | Discharge status: Home / Self Care | Payer: BC Managed Care – PPO | Source: Ambulatory Visit | Attending: Family Medicine | Admitting: Family Medicine

## 2016-03-17 ENCOUNTER — Ambulatory Visit (INDEPENDENT_AMBULATORY_CARE_PROVIDER_SITE_OTHER): Payer: BC Managed Care – PPO | Admitting: Family Medicine

## 2016-03-17 VITALS — BP 128/80 | HR 68 | Temp 98.1°F | Ht 70.0 in | Wt 198.2 lb

## 2016-03-17 DIAGNOSIS — N926 Irregular menstruation, unspecified: Secondary | ICD-10-CM

## 2016-03-17 DIAGNOSIS — E538 Deficiency of other specified B group vitamins: Secondary | ICD-10-CM

## 2016-03-17 DIAGNOSIS — Z1211 Encounter for screening for malignant neoplasm of colon: Secondary | ICD-10-CM

## 2016-03-17 DIAGNOSIS — Z131 Encounter for screening for diabetes mellitus: Secondary | ICD-10-CM | POA: Diagnosis not present

## 2016-03-17 DIAGNOSIS — Z1151 Encounter for screening for human papillomavirus (HPV): Secondary | ICD-10-CM | POA: Diagnosis present

## 2016-03-17 DIAGNOSIS — Z124 Encounter for screening for malignant neoplasm of cervix: Secondary | ICD-10-CM

## 2016-03-17 DIAGNOSIS — Z01411 Encounter for gynecological examination (general) (routine) with abnormal findings: Secondary | ICD-10-CM | POA: Insufficient documentation

## 2016-03-17 DIAGNOSIS — D510 Vitamin B12 deficiency anemia due to intrinsic factor deficiency: Secondary | ICD-10-CM

## 2016-03-17 DIAGNOSIS — Z0000 Encounter for general adult medical examination without abnormal findings: Secondary | ICD-10-CM

## 2016-03-17 LAB — GLUCOSE, RANDOM: GLUCOSE: 88 mg/dL (ref 70–99)

## 2016-03-17 LAB — VITAMIN B12: Vitamin B-12: 363 pg/mL (ref 211–911)

## 2016-03-17 MED ORDER — CYANOCOBALAMIN 1000 MCG/ML IJ SOLN: 1000.0000 ug | INTRAMUSCULAR | 3 refills | Status: DC

## 2016-03-17 MED ORDER — SYRINGE (DISPOSABLE) 1 ML MISC: 1 refills | Status: DC

## 2016-03-17 MED ORDER — NORETHIN ACE-ETH ESTRAD-FE 1-20 MG-MCG PO TABS: 1.0000 | ORAL_TABLET | Freq: Every day | ORAL | 3 refills | Status: DC

## 2016-03-17 NOTE — Patient Instructions (Addendum)
Jessica Bennett will call about your referral. Stay on the pills for now and consider an endometrial ablation if you have continued irregular periods.   Go to the lab on the way out.  We'll contact you with your lab report. Take care.  Glad to see you.  Update me as needed.

## 2016-03-17 NOTE — Progress Notes (Signed)
CPE- See plan.  Routine anticipatory guidance given to patient.  See health maintenance.  Tetanus 2013 Flu to be done at pharmacy.  PNA and shingles not due. Pap due, d/w pt.   Mammogram done last year.  Not due yet this year.   DXA not due, d/w pt.   D/w patient [id number] for colon cancer screening, including IFOB vs. colonoscopy.  Risks and benefits of both were discussed and patient voiced understanding.  Pt elects for: colonoscopy.  Living will d/w pt.  Would have her mother designated if patient were incapacitated.   Diet and exercise d/w pt.  Intentional weight loss (70lbs) in the last few years with diet and exercise.  HCV not indicated HIV screening likely done with prenatal labs in 1995.    Vit B12 def on replacement.    Still on OCPs.  Still on them to regulate her menses.  She did better on current med.    Recurrent mild L upper quadrant pain for about 2 months.  No FCNAVD.  No blood in stool.  No blood in urine.  Intermittent pain, sometimes she doesn't notice it.  Slightly tender to the touch. Still able to exercise.    PMH and SH reviewed  Meds, vitals, and allergies reviewed.   ROS: Per HPI.  Unless specifically indicated otherwise in HPI, the patient denies:  General: fever. Eyes: acute vision changes ENT: sore throat Cardiovascular: chest pain Respiratory: SOB GI: vomiting GU: dysuria Musculoskeletal: acute back pain Derm: acute rash Neuro: acute motor dysfunction Psych: worsening mood Endocrine: polydipsia Heme: bleeding Allergy: hayfever  GEN: nad, alert and oriented HEENT: mucous membranes moist NECK: supple w/o LA CV: rrr. PULM: ctab, no inc wob ABD: soft, +bs EXT: no edema SKIN: no acute rash Left side of the abdominal wall is not really tender to palpation in the left upper quadrant. I do not feel any masses. No bruising no erythema. No rash. No rebound. Overall benign abdominal exam. Normal introitus for age, no external lesions, no vaginal  discharge, mucosa pink and moist, no vaginal or cervical lesions, no vaginal atrophy, no friaility or hemorrhage, normal uterus size and position, no adnexal masses or tenderness.  Chaperoned exam.  Her cervix is tilted, but this is typical for her, she said she had been told this before.

## 2016-03-17 NOTE — Progress Notes (Signed)
Pre visit review using our clinic review tool, if applicable. No additional management support is needed unless otherwise documented below in the visit note. 

## 2016-03-18 DIAGNOSIS — N926 Irregular menstruation, unspecified: Secondary | ICD-10-CM | POA: Insufficient documentation

## 2016-03-18 LAB — CYTOLOGY - PAP

## 2016-03-18 NOTE — Assessment & Plan Note (Signed)
See f/u labs.

## 2016-03-18 NOTE — Assessment & Plan Note (Signed)
Tetanus 2013 Flu to be done at pharmacy.  PNA and shingles not due. Pap due, d/w pt.   Mammogram done last year.  Not due yet this year.   DXA not due, d/w pt.   D/w patient [id number] for colon cancer screening, including IFOB vs. colonoscopy.  Risks and benefits of both were discussed and patient voiced understanding.  Pt elects for: colonoscopy.  Living will d/w pt.  Would have her mother designated if patient were incapacitated.   Diet and exercise d/w pt.  Intentional weight loss (70lbs) in the last few years with diet and exercise.  HCV not indicated HIV screening likely done with prenatal labs in 1995.   Glucose pending.   She also likely has an abdominal wall strain that is mild, discussed with patient, she will update me if she has continual symptoms. Exercise as tolerated. She agrees.

## 2016-03-18 NOTE — Assessment & Plan Note (Signed)
I would continue her current birth control pills for now, but she may need to consider a uterine ablation. She may be able to get off her birth control pills, and if she had really irregular periods this may help her. She'll consider it and update me. We can refer back to gynecology if needed.

## 2016-03-22 ENCOUNTER — Encounter: Payer: Self-pay | Admitting: Gastroenterology

## 2016-04-26 ENCOUNTER — Other Ambulatory Visit: Payer: Self-pay | Admitting: Family Medicine

## 2016-04-26 DIAGNOSIS — Z1231 Encounter for screening mammogram for malignant neoplasm of breast: Secondary | ICD-10-CM

## 2016-05-10 ENCOUNTER — Ambulatory Visit (AMBULATORY_SURGERY_CENTER): Payer: Self-pay

## 2016-05-10 VITALS — Ht 70.0 in | Wt 204.6 lb

## 2016-05-10 DIAGNOSIS — Z1211 Encounter for screening for malignant neoplasm of colon: Secondary | ICD-10-CM

## 2016-05-10 MED ORDER — SUPREP BOWEL PREP KIT 17.5-3.13-1.6 GM/177ML PO SOLN: 1.0000 | Freq: Once | ORAL | 0 refills | Status: AC

## 2016-05-10 NOTE — Progress Notes (Signed)
No allergies to eggs or soy No past problems with anesthesia except PONV with general No diet meds No home oxygen  Registered emmi

## 2016-05-12 ENCOUNTER — Encounter: Payer: Self-pay | Admitting: Gastroenterology

## 2016-05-13 ENCOUNTER — Telehealth: Payer: Self-pay

## 2016-05-13 NOTE — Telephone Encounter (Signed)
Pt left v/m; Dr Damita Dunnings referred pt for colonoscopy; pt was scheduled with [facility] for colonoscopy but pts ins would not approve and pt request colonoscopy scheduled with different group that ins will cover. Pt request cb.

## 2016-05-16 NOTE — Telephone Encounter (Signed)
Referral faxed to [facility] for a Office Based Colonoscopy due to patients insurance.

## 2016-05-23 ENCOUNTER — Encounter: Payer: BC Managed Care – PPO | Admitting: Gastroenterology

## 2016-06-01 ENCOUNTER — Other Ambulatory Visit: Payer: Self-pay | Admitting: Family Medicine

## 2016-06-01 ENCOUNTER — Ambulatory Visit
Admission: RE | Admit: 2016-06-01 | Discharge: 2016-06-01 | Disposition: A | Discharge status: Home / Self Care | Payer: BC Managed Care – PPO | Source: Ambulatory Visit | Attending: Family Medicine | Admitting: Family Medicine

## 2016-06-01 DIAGNOSIS — Z1231 Encounter for screening mammogram for malignant neoplasm of breast: Secondary | ICD-10-CM | POA: Insufficient documentation

## 2016-06-07 ENCOUNTER — Encounter: Payer: Self-pay | Admitting: *Deleted

## 2016-07-08 ENCOUNTER — Ambulatory Visit (INDEPENDENT_AMBULATORY_CARE_PROVIDER_SITE_OTHER): Payer: BC Managed Care – PPO | Admitting: Family Medicine

## 2016-07-08 ENCOUNTER — Encounter: Payer: Self-pay | Admitting: Family Medicine

## 2016-07-08 VITALS — BP 118/72 | HR 72 | Temp 98.0°F | Wt 210.0 lb

## 2016-07-08 DIAGNOSIS — R1012 Left upper quadrant pain: Secondary | ICD-10-CM | POA: Diagnosis not present

## 2016-07-08 LAB — CBC WITH DIFFERENTIAL/PLATELET
BASOS PCT: 1 %
Basophils Absolute: 69 cells/uL (ref 0–200)
EOS ABS: 69 {cells}/uL (ref 15–500)
Eosinophils Relative: 1 %
HEMATOCRIT: 39.7 % (ref 35.0–45.0)
Hemoglobin: 13.1 g/dL (ref 11.7–15.5)
LYMPHS PCT: 35 %
Lymphs Abs: 2415 cells/uL (ref 850–3900)
MCH: 29.3 pg (ref 27.0–33.0)
MCHC: 33 g/dL (ref 32.0–36.0)
MCV: 88.8 fL (ref 80.0–100.0)
MONO ABS: 483 {cells}/uL (ref 200–950)
MPV: 8.7 fL (ref 7.5–12.5)
Monocytes Relative: 7 %
Neutro Abs: 3864 cells/uL (ref 1500–7800)
Neutrophils Relative %: 56 %
Platelets: 303 10*3/uL (ref 140–400)
RBC: 4.47 MIL/uL (ref 3.80–5.10)
RDW: 13.1 % (ref 11.0–15.0)
WBC: 6.9 10*3/uL (ref 3.8–10.8)

## 2016-07-08 NOTE — Patient Instructions (Signed)
Go to the lab on the way out.  We'll contact you with your lab report. We'll go from there.  Take care.  Glad to see you.  

## 2016-07-08 NOTE — Progress Notes (Signed)
Prev OV note:  Recurrent mild L upper quadrant pain for about 2 months.  No FCNAVD.  No blood in stool.  No blood in urine.  Intermittent pain, sometimes she doesn't notice it.  Slightly tender to the touch. Still able to exercise.    Exam prev with Left side of the abdominal wall is not really tender to palpation in the left upper quadrant. I do not feel any masses. No bruising no erythema. No rash. No rebound. Overall benign abdominal exam.  The above d/w pt.  Update today:  She still has had pain for several months on the same area.  She has seen GI recently.   Pain is not constantly noted now, unclear how much of that is from distraction.   No FCNAVD.  No blood in stool or urine.  Still able to exercise.  No weight loss.  Swallowing well.  No heartburn.    Meds, vitals, and allergies reviewed.   ROS: Per HPI unless specifically indicated in ROS section   GEN: nad, alert and oriented HEENT: mucous membranes moist NECK: supple w/o LA CV: rrr. PULM: ctab, no inc wob ABD: soft, +bs, not ttp, no rebound EXT: no edema SKIN: no acute rash

## 2016-07-08 NOTE — Progress Notes (Signed)
Pre visit review using our clinic review tool, if applicable. No additional management support is needed unless otherwise documented below in the visit note. 

## 2016-07-09 LAB — COMPREHENSIVE METABOLIC PANEL
ALT: 11 U/L (ref 6–29)
AST: 15 U/L (ref 10–35)
Albumin: 4.1 g/dL (ref 3.6–5.1)
Alkaline Phosphatase: 38 U/L (ref 33–130)
BUN: 15 mg/dL (ref 7–25)
CHLORIDE: 106 mmol/L (ref 98–110)
CO2: 25 mmol/L (ref 20–31)
CREATININE: 0.91 mg/dL (ref 0.50–1.05)
Calcium: 9.5 mg/dL (ref 8.6–10.4)
GLUCOSE: 90 mg/dL (ref 65–99)
Potassium: 4.9 mmol/L (ref 3.5–5.3)
SODIUM: 140 mmol/L (ref 135–146)
Total Bilirubin: 0.5 mg/dL (ref 0.2–1.2)
Total Protein: 6.7 g/dL (ref 6.1–8.1)

## 2016-07-09 LAB — LIPASE: Lipase: 27 U/L (ref 7–60)

## 2016-07-10 DIAGNOSIS — R1012 Left upper quadrant pain: Secondary | ICD-10-CM | POA: Insufficient documentation

## 2016-07-10 NOTE — Assessment & Plan Note (Signed)
Continued, worse, no clear source seen.  Needs w/u.  Check routine labs today.  If wnl, then likely proceed with imaging.  See notes on labs.  All d/w pt.    Addendum- labs wnl, proceed with CT.  eval for colitis, splenic lesion, etc.

## 2016-07-15 ENCOUNTER — Other Ambulatory Visit: Payer: Self-pay | Admitting: Family Medicine

## 2016-07-15 DIAGNOSIS — R1012 Left upper quadrant pain: Secondary | ICD-10-CM

## 2016-07-15 NOTE — Progress Notes (Signed)
Ct approved for abd, not pelvis.  This is okay with me.  New order placed. I couldn't cancel the old order.  I need that removed.

## 2016-07-19 ENCOUNTER — Other Ambulatory Visit: Payer: Self-pay | Admitting: Family Medicine

## 2016-07-19 ENCOUNTER — Telehealth: Payer: Self-pay | Admitting: Family Medicine

## 2016-07-19 DIAGNOSIS — R1012 Left upper quadrant pain: Secondary | ICD-10-CM

## 2016-07-19 NOTE — Telephone Encounter (Signed)
Stephanie at [facility] called.  Patient has an appointment tomorrow for Left upper quadrant pain.  The order was for Abdomen w/o contrast.  For this diagnosis they need for it to be for Abdomen w/ contrast unless allergic or kidney function is elevated.  Please change order.

## 2016-07-19 NOTE — Telephone Encounter (Signed)
Please talk to me about this and notify pt.  I talked with radiology.  They suggested CT abd with contrast.  We'll likely need another PA done on this.  I wouldn't proceed yet with the imaging as orderd, I didn't change the order yet.  Let me know if she'll need another PA.   Thanks.

## 2016-07-19 NOTE — Telephone Encounter (Signed)
Ordered. Thanks

## 2016-07-20 ENCOUNTER — Ambulatory Visit: Payer: BC Managed Care – PPO

## 2016-07-20 ENCOUNTER — Ambulatory Visit
Admission: RE | Admit: 2016-07-20 | Discharge: 2016-07-20 | Disposition: A | Discharge status: Home / Self Care | Payer: BC Managed Care – PPO | Source: Ambulatory Visit | Attending: Family Medicine | Admitting: Family Medicine

## 2016-07-20 DIAGNOSIS — R1012 Left upper quadrant pain: Secondary | ICD-10-CM | POA: Diagnosis present

## 2016-07-20 MED ORDER — IOPAMIDOL (ISOVUE-300) INJECTION 61%
125.0000 mL | Freq: Once | INTRAVENOUS | Status: AC | PRN
  Administered 2016-07-20: 125 mL via INTRAVENOUS

## 2016-09-01 LAB — HM COLONOSCOPY

## 2017-02-17 ENCOUNTER — Other Ambulatory Visit: Payer: Self-pay | Admitting: Family Medicine

## 2017-02-17 NOTE — Telephone Encounter (Signed)
Electronic refill request. Norethindrone-ethinyl estradiol. Last office visit:   07/08/16 Last Filled:    3 Package 3 03/17/2016  Please advise.

## 2017-02-18 NOTE — Telephone Encounter (Signed)
Sent.  Due for CPE.  Thanks.  

## 2017-02-20 NOTE — Telephone Encounter (Signed)
Left detailed message on voicemail.  

## 2017-04-27 ENCOUNTER — Ambulatory Visit (INDEPENDENT_AMBULATORY_CARE_PROVIDER_SITE_OTHER): Payer: BC Managed Care – PPO | Admitting: Family Medicine

## 2017-04-27 ENCOUNTER — Encounter: Payer: Self-pay | Admitting: Family Medicine

## 2017-04-27 VITALS — BP 130/90 | HR 84 | Temp 97.8°F | Ht 70.0 in | Wt 207.5 lb

## 2017-04-27 DIAGNOSIS — Z131 Encounter for screening for diabetes mellitus: Secondary | ICD-10-CM | POA: Diagnosis not present

## 2017-04-27 DIAGNOSIS — Z0000 Encounter for general adult medical examination without abnormal findings: Secondary | ICD-10-CM | POA: Diagnosis not present

## 2017-04-27 DIAGNOSIS — R1012 Left upper quadrant pain: Secondary | ICD-10-CM

## 2017-04-27 DIAGNOSIS — D510 Vitamin B12 deficiency anemia due to intrinsic factor deficiency: Secondary | ICD-10-CM | POA: Diagnosis not present

## 2017-04-27 DIAGNOSIS — N926 Irregular menstruation, unspecified: Secondary | ICD-10-CM

## 2017-04-27 LAB — CBC WITH DIFFERENTIAL/PLATELET
Basophils Absolute: 0.1 10*3/uL (ref 0.0–0.1)
Basophils Relative: 1.1 % (ref 0.0–3.0)
Eosinophils Absolute: 0.1 10*3/uL (ref 0.0–0.7)
Eosinophils Relative: 1.8 % (ref 0.0–5.0)
HEMATOCRIT: 43.4 % (ref 36.0–46.0)
Hemoglobin: 14.3 g/dL (ref 12.0–15.0)
LYMPHS ABS: 2.5 10*3/uL (ref 0.7–4.0)
LYMPHS PCT: 36 % (ref 12.0–46.0)
MCHC: 33 g/dL (ref 30.0–36.0)
MCV: 88.7 fl (ref 78.0–100.0)
Monocytes Absolute: 0.4 10*3/uL (ref 0.1–1.0)
Monocytes Relative: 5.5 % (ref 3.0–12.0)
NEUTROS ABS: 3.8 10*3/uL (ref 1.4–7.7)
NEUTROS PCT: 55.6 % (ref 43.0–77.0)
PLATELETS: 294 10*3/uL (ref 150.0–400.0)
RBC: 4.89 Mil/uL (ref 3.87–5.11)
RDW: 13.1 % (ref 11.5–15.5)
WBC: 6.8 10*3/uL (ref 4.0–10.5)

## 2017-04-27 LAB — VITAMIN B12: Vitamin B-12: 783 pg/mL (ref 211–911)

## 2017-04-27 LAB — GLUCOSE, RANDOM: Glucose, Bld: 88 mg/dL (ref 70–99)

## 2017-04-27 MED ORDER — NORETHIN ACE-ETH ESTRAD-FE 1-20 MG-MCG PO TABS: 1.0000 | ORAL_TABLET | Freq: Every day | ORAL | 3 refills | Status: DC

## 2017-04-27 MED ORDER — CYANOCOBALAMIN 1000 MCG/ML IJ SOLN: 1000.0000 ug | INTRAMUSCULAR | 3 refills | Status: DC

## 2017-04-27 MED ORDER — SYRINGE (DISPOSABLE) 1 ML MISC: 1 refills | Status: DC

## 2017-04-27 NOTE — Progress Notes (Addendum)
CPE- See plan.  Routine anticipatory guidance given to patient.  See health maintenance.  The possibility exists that previously documented standard health maintenance information may have been brought forward from a previous encounter into this note.  If needed, that same information has been updated to reflect the current situation based on today's encounter.    Tetanus 2013 Flu done at pharmacy, end of 03/2017 PNA and shingles not due. Pap not due, d/w pt.   Mammogram done last year.  Not due yet this year.   DXA not due, d/w pt.  Colonoscopy 08/2016 with 5 year f/u.   Living will d/w pt.  Would have her mother designated if patient were incapacitated.   Diet and exercise d/w pt.  Stable weight, h/o intentional weight loss (70lbs) in the last few years with diet and exercise.  HCV not indicated.   HIV screening likely done with prenatal labs in 1995.    She tried prilosec for the LUQ pain that was intermittent.  It seemed like the med helped some, still with intermittent sx.  Prev imaging neg and she has seen GI.  She isn't worse.  She is some better.  We discussed, she likely doesn't have an ominous process.    Irregular menses prev, now on OCP.  D/w pt about options.  She is already having hot flashes episodically, over the last 24 months.   B12 def on replacement.  Labs pending.  D/w pt.  See notes on labs.    PMH and SH reviewed  Meds, vitals, and allergies reviewed.   ROS: Per HPI.  Unless specifically indicated otherwise in HPI, the patient denies:  General: fever. Eyes: acute vision changes ENT: sore throat Cardiovascular: chest pain Respiratory: SOB GI: vomiting GU: dysuria Musculoskeletal: acute back pain Derm: acute rash Neuro: acute motor dysfunction Psych: worsening mood Endocrine: polydipsia Heme: bleeding Allergy: hayfever  GEN: nad, alert and oriented HEENT: mucous membranes moist NECK: supple w/o LA CV: rrr. PULM: ctab, no inc wob ABD: soft, +bs EXT:  no edema SKIN: no acute rash

## 2017-04-27 NOTE — Patient Instructions (Addendum)
Worth trying ranitidine to see if that helps.   Go to the lab on the way out.  We'll contact you with your lab report. Take care.  Glad to see you.  Update me as needed.

## 2017-04-30 NOTE — Assessment & Plan Note (Signed)
Tetanus 2013 Flu done at pharmacy, end of 03/2017 PNA and shingles not due. Pap not due, d/w pt.   Mammogram done last year.  Not due yet this year.   DXA not due, d/w pt.  Colonoscopy 08/2016 with 5 year f/u.   Living will d/w pt.  Would have her mother designated if patient were incapacitated.   Diet and exercise d/w pt.  Stable weight, h/o intentional weight loss (70lbs) in the last few years with diet and exercise.  HCV not indicated.   HIV screening likely done with prenatal labs in 1995.

## 2017-04-30 NOTE — Assessment & Plan Note (Signed)
She tried prilosec for the LUQ pain that was intermittent.  It seemed like the med helped some, still with intermittent sx.  Prev imaging neg and she has seen GI.  She isn't worse.  She is some better.  We discussed, she likely doesn't have an ominous process.   Presumed GERD, would be worth trying ranitidine to see if that helps.

## 2017-04-30 NOTE — Assessment & Plan Note (Signed)
I want to consider options and we will be in touch with the patient. She agrees.

## 2017-04-30 NOTE — Assessment & Plan Note (Signed)
See notes on labs. 

## 2017-05-01 ENCOUNTER — Other Ambulatory Visit: Payer: Self-pay | Admitting: Family Medicine

## 2017-05-01 DIAGNOSIS — Z1231 Encounter for screening mammogram for malignant neoplasm of breast: Secondary | ICD-10-CM

## 2017-05-07 ENCOUNTER — Telehealth: Payer: Self-pay | Admitting: Family Medicine

## 2017-05-07 NOTE — Telephone Encounter (Signed)
Please call Pt.  I did some considering about her HRT/OCPs.  I don't know is she is still going to have any (or heavy) periods in the near future, especially with the change in med.  That said, it is still likely reasonable to try tapering her pills by one pill per week as tolerated.  6 pills for a week for a few weeks if tolerated, then down to 6 pills a week, etc.   If she has a lot of hot flashes that are intolerable, sig trouble with menses, etc, then restart the prev dose and let me me know.  It is likely best to try tapering and not add another med at this point.  Let me know if she isn't okay with this.  Thanks.

## 2017-05-08 NOTE — Telephone Encounter (Signed)
Patient advised and is agreeable.

## 2017-06-08 ENCOUNTER — Ambulatory Visit
Admission: RE | Admit: 2017-06-08 | Discharge: 2017-06-08 | Disposition: A | Discharge status: Home / Self Care | Payer: BC Managed Care – PPO | Source: Ambulatory Visit | Attending: Family Medicine | Admitting: Family Medicine

## 2017-06-08 DIAGNOSIS — Z1231 Encounter for screening mammogram for malignant neoplasm of breast: Secondary | ICD-10-CM | POA: Insufficient documentation

## 2017-11-13 ENCOUNTER — Encounter: Payer: Self-pay | Admitting: Family Medicine

## 2017-11-27 ENCOUNTER — Encounter: Payer: Self-pay | Admitting: Family Medicine

## 2017-11-27 ENCOUNTER — Ambulatory Visit: Payer: BC Managed Care – PPO | Admitting: Family Medicine

## 2017-11-27 VITALS — BP 122/70 | HR 83 | Temp 98.4°F | Ht 70.0 in | Wt 202.8 lb

## 2017-11-27 DIAGNOSIS — L659 Nonscarring hair loss, unspecified: Secondary | ICD-10-CM

## 2017-11-27 LAB — COMPREHENSIVE METABOLIC PANEL
ALBUMIN: 4.4 g/dL (ref 3.5–5.2)
ALT: 14 U/L (ref 0–35)
AST: 16 U/L (ref 0–37)
Alkaline Phosphatase: 60 U/L (ref 39–117)
BILIRUBIN TOTAL: 0.6 mg/dL (ref 0.2–1.2)
BUN: 15 mg/dL (ref 6–23)
CO2: 30 meq/L (ref 19–32)
Calcium: 9.8 mg/dL (ref 8.4–10.5)
Chloride: 104 mEq/L (ref 96–112)
Creatinine, Ser: 0.88 mg/dL (ref 0.40–1.20)
GFR: 71.67 mL/min (ref >60.00)
Glucose, Bld: 100 mg/dL — ABNORMAL HIGH (ref 70–99)
Potassium: 4.5 mEq/L (ref 3.5–5.1)
Sodium: 141 mEq/L (ref 135–145)
Total Protein: 7.5 g/dL (ref 6.0–8.3)

## 2017-11-27 LAB — CBC WITH DIFFERENTIAL/PLATELET
BASOS ABS: 0.1 10*3/uL (ref 0.0–0.1)
BASOS PCT: 0.8 % (ref 0.0–3.0)
Eosinophils Absolute: 0.1 10*3/uL (ref 0.0–0.7)
Eosinophils Relative: 1.4 % (ref 0.0–5.0)
HEMATOCRIT: 40.2 % (ref 36.0–46.0)
Hemoglobin: 13.9 g/dL (ref 12.0–15.0)
LYMPHS PCT: 31.2 % (ref 12.0–46.0)
Lymphs Abs: 2 10*3/uL (ref 0.7–4.0)
MCHC: 34.5 g/dL (ref 30.0–36.0)
MCV: 87.7 fl (ref 78.0–100.0)
MONOS PCT: 4.8 % (ref 3.0–12.0)
Monocytes Absolute: 0.3 10*3/uL (ref 0.1–1.0)
NEUTROS ABS: 4 10*3/uL (ref 1.4–7.7)
Neutrophils Relative %: 61.8 % (ref 43.0–77.0)
PLATELETS: 326 10*3/uL (ref 150.0–400.0)
RBC: 4.59 Mil/uL (ref 3.87–5.11)
RDW: 12.9 % (ref 11.5–15.5)
WBC: 6.5 10*3/uL (ref 4.0–10.5)

## 2017-11-27 LAB — VITAMIN B12: VITAMIN B 12: 321 pg/mL (ref 211–911)

## 2017-11-27 LAB — TSH: TSH: 1.39 u[IU]/mL (ref 0.35–4.50)

## 2017-11-27 NOTE — Progress Notes (Signed)
Hair loss noted.  Noted since earlier this year.  Receeding hair line frontally.  She doesn't have bald patches but had diffuse loss.  She had thick hair at baseline but it feels thinner than her baseline.  She has used occ coloring but no changes o/w over the last few years.  Symptoms started after she tapered off of OCPs.  H/o B12 def on replacement.  Compliant.    She still has periods, regular.  1 day with high flow but tolerable o/w.  She had some hot flashes but not as bad as prev.  Assuming the hair loss wasn't related to OCP taper, her menstrual sx wouldn't be bothersome enough to patient to restart OCPs.    Meds, vitals, and allergies reviewed.   ROS: Per HPI unless specifically indicated in ROS section   GEN: nad, alert and oriented HEENT: mucous membranes moist, scalp without any scarring alopecia/bald spots noted. NECK: supple w/o LA, no tmg CV: rrr. PULM: ctab, no inc wob EXT: no edema SKIN: Well-perfused.  No rash.

## 2017-11-27 NOTE — Patient Instructions (Signed)
Don't change your meds for now.  Go to the lab on the way out.  We'll contact you with your lab report. We'll be in touch.  Take care.  Glad to see you.  

## 2017-11-28 DIAGNOSIS — L659 Nonscarring hair loss, unspecified: Secondary | ICD-10-CM | POA: Insufficient documentation

## 2017-11-28 NOTE — Assessment & Plan Note (Signed)
This started after tapering off OCPs.  It could be that this is hormone related.  Reasonable to check other routine labs in the meantime to exclude reversible causes.  Discussed with patient.  She agrees.  I did not check an [facility] since she is having regular periods at baseline.  Rationale discussed with patient.  I want to see her labs and consider options.  It may be that she ends up needing a retrial of oral contraceptives but I want to see her labs first. She agrees.

## 2017-12-04 ENCOUNTER — Other Ambulatory Visit: Payer: Self-pay | Admitting: Family Medicine

## 2017-12-04 NOTE — Progress Notes (Signed)
See result note.  

## 2017-12-04 NOTE — Progress Notes (Signed)
Noted. Thanks.  I'll await update from the patient.

## 2017-12-04 NOTE — Progress Notes (Signed)
Results given to patient by telephone and verbalized understanding. Patient stated that she has a  dermatologist  (Dr. Nehemiah Massed) at [facility]. Patient stated that she will call and get an appointment with him. Patient stated that she will hold off on starting back on OCPs until after she sees Dr. Nehemiah Massed. Patient stated that she will call back if she decides to start back on the medication.

## 2017-12-29 IMAGING — MG MM DIGITAL SCREENING BILAT W/ TOMO W/ CAD
8 of 12 series · 8 of 28 positions shown · non-contrast
Comparison: Previous exam(s).

CLINICAL DATA: Screening.

EXAM:
2D DIGITAL SCREENING BILATERAL MAMMOGRAM WITH CAD AND ADJUNCT TOMO

[R MLO]
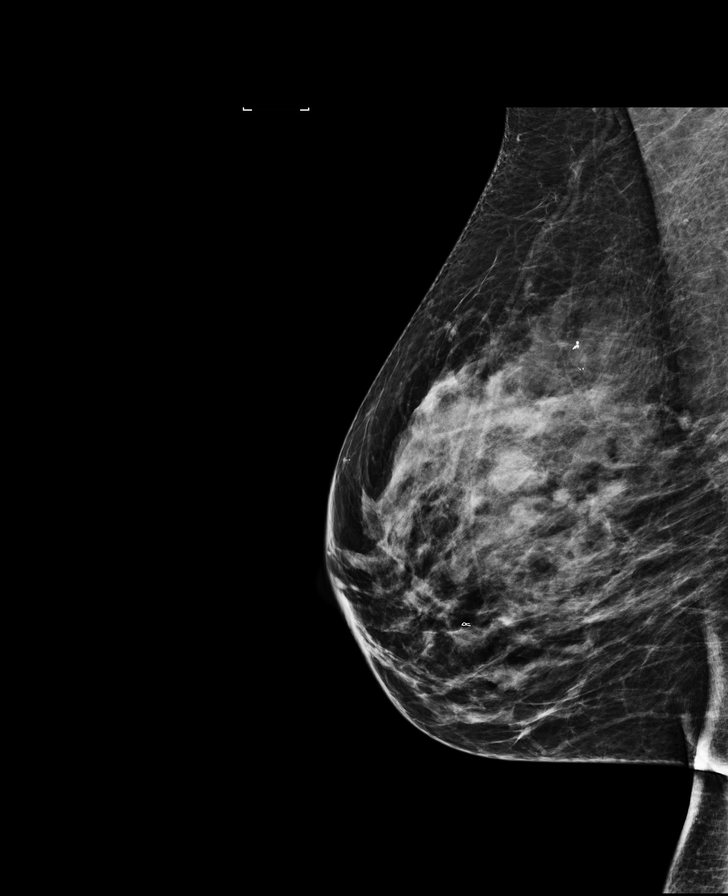

[L MLO]
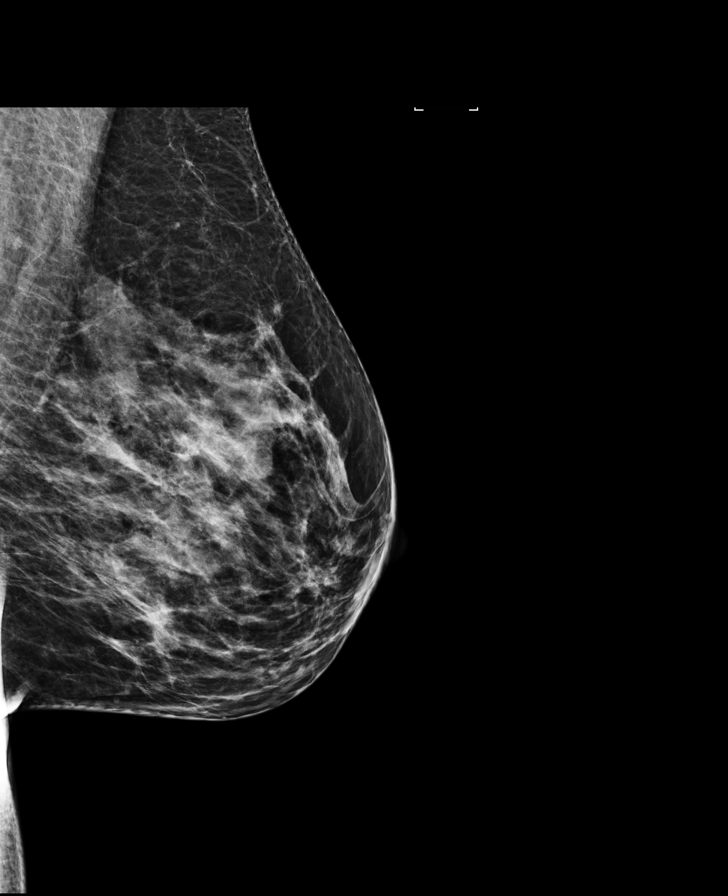

[L MLO synth-2D]
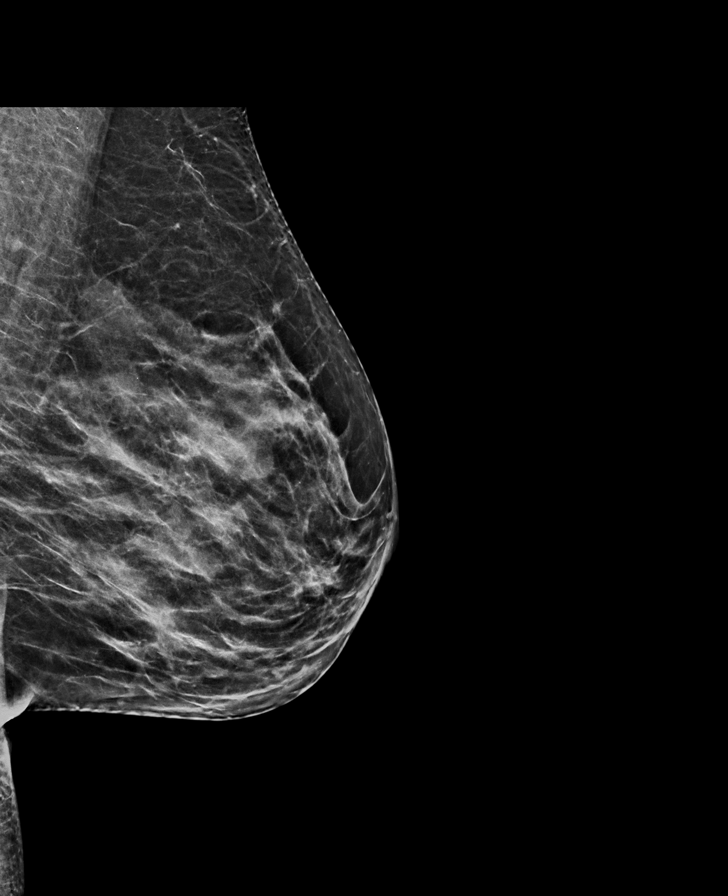

[L CC]
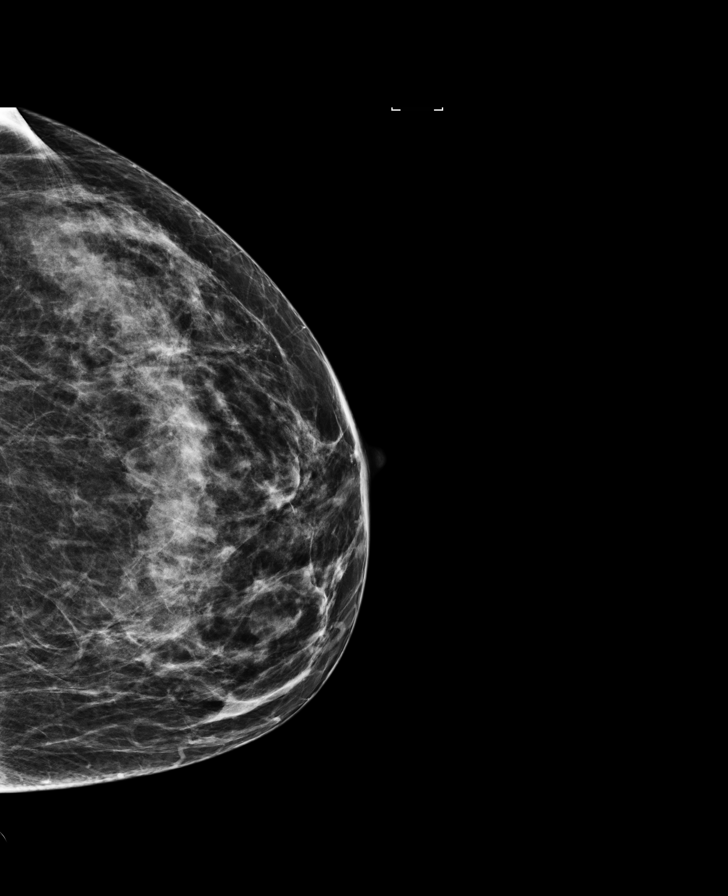

[R CC synth-2D]
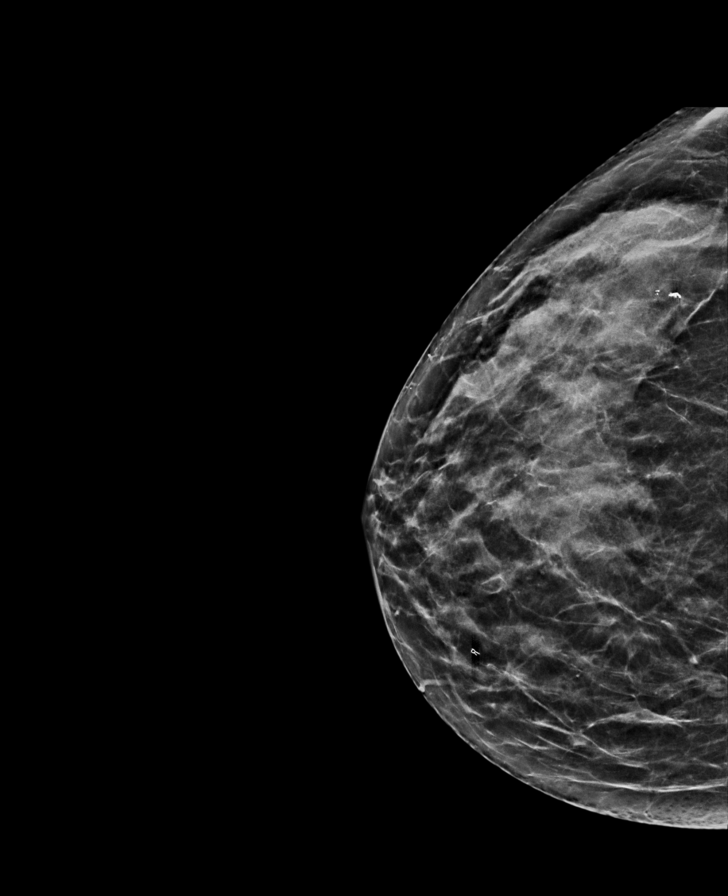

[R CC]
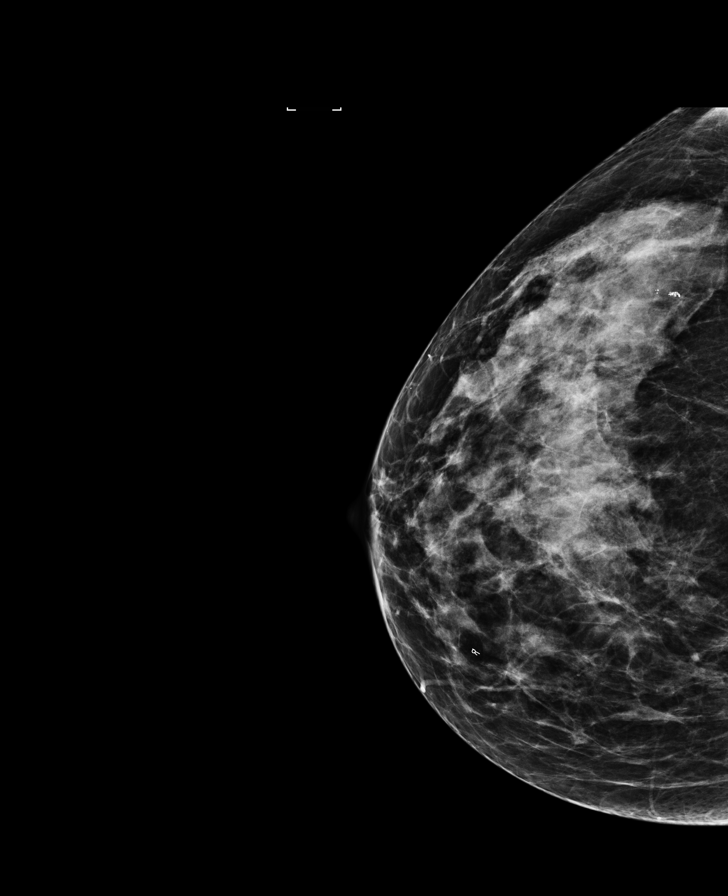

[L CC synth-2D]
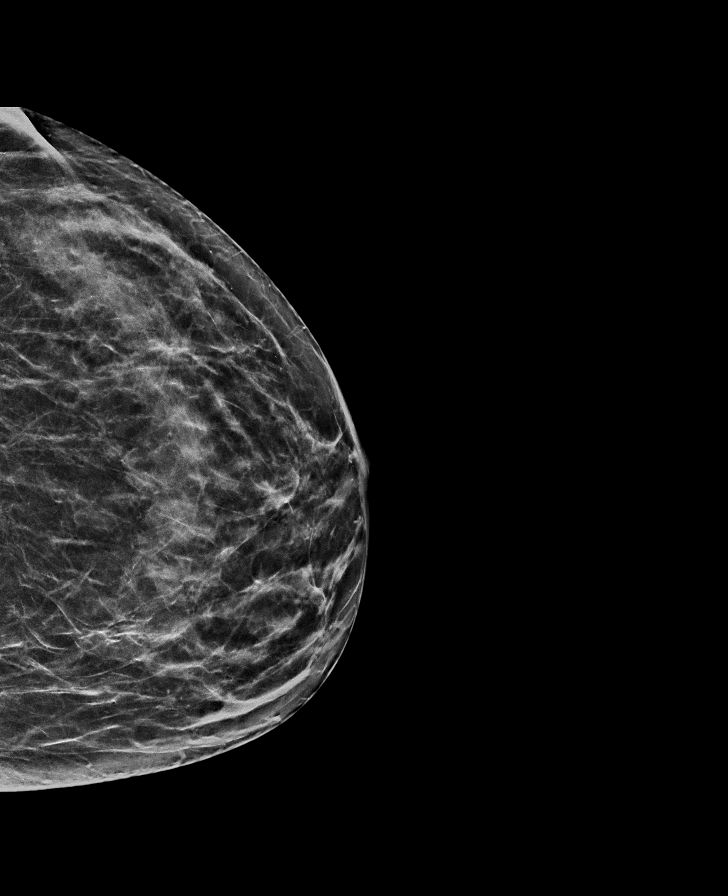

[R MLO synth-2D]
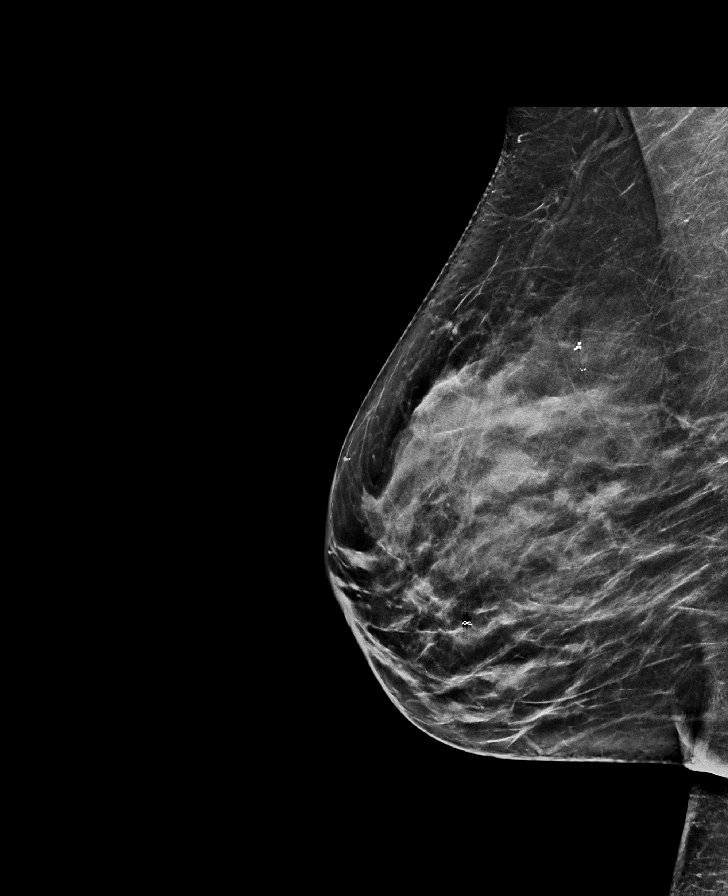

[8 of 28 positions shown; findings below may reference images not displayed]

ACR Breast Density Category c: The breast tissue is heterogeneously
dense, which may obscure small masses.
FINDINGS: There are no findings suspicious for malignancy. Images were
processed with CAD.
IMPRESSION: No mammographic evidence of malignancy. A result letter of this
screening mammogram will be mailed directly to the patient.

RECOMMENDATION:
Screening mammogram in one year. (Code:[id number])

BI-RADS CATEGORY  1: Negative.

## 2018-06-11 ENCOUNTER — Other Ambulatory Visit: Payer: Self-pay | Admitting: Family Medicine

## 2018-06-11 DIAGNOSIS — Z1231 Encounter for screening mammogram for malignant neoplasm of breast: Secondary | ICD-10-CM

## 2018-06-27 ENCOUNTER — Ambulatory Visit
Admission: RE | Admit: 2018-06-27 | Discharge: 2018-06-27 | Disposition: A | Discharge status: Home / Self Care | Payer: BC Managed Care – PPO | Source: Ambulatory Visit | Attending: Family Medicine | Admitting: Family Medicine

## 2018-06-27 DIAGNOSIS — Z1231 Encounter for screening mammogram for malignant neoplasm of breast: Secondary | ICD-10-CM | POA: Diagnosis not present

## 2018-12-18 ENCOUNTER — Other Ambulatory Visit: Payer: Self-pay | Admitting: Family Medicine

## 2018-12-18 DIAGNOSIS — D510 Vitamin B12 deficiency anemia due to intrinsic factor deficiency: Secondary | ICD-10-CM

## 2018-12-18 DIAGNOSIS — L659 Nonscarring hair loss, unspecified: Secondary | ICD-10-CM

## 2018-12-19 ENCOUNTER — Other Ambulatory Visit (INDEPENDENT_AMBULATORY_CARE_PROVIDER_SITE_OTHER): Payer: BC Managed Care – PPO

## 2018-12-19 DIAGNOSIS — D510 Vitamin B12 deficiency anemia due to intrinsic factor deficiency: Secondary | ICD-10-CM

## 2018-12-19 DIAGNOSIS — L659 Nonscarring hair loss, unspecified: Secondary | ICD-10-CM

## 2018-12-19 LAB — CBC WITH DIFFERENTIAL/PLATELET
Basophils Absolute: 0.1 10*3/uL (ref 0.0–0.1)
Basophils Relative: 1.2 % (ref 0.0–3.0)
Eosinophils Absolute: 0.2 10*3/uL (ref 0.0–0.7)
Eosinophils Relative: 2.5 % (ref 0.0–5.0)
HCT: 40 % (ref 36.0–46.0)
Hemoglobin: 13 g/dL (ref 12.0–15.0)
Lymphocytes Relative: 36.7 % (ref 12.0–46.0)
Lymphs Abs: 2.4 10*3/uL (ref 0.7–4.0)
MCHC: 32.6 g/dL (ref 30.0–36.0)
MCV: 88.6 fl (ref 78.0–100.0)
Monocytes Absolute: 0.3 10*3/uL (ref 0.1–1.0)
Monocytes Relative: 4 % (ref 3.0–12.0)
Neutro Abs: 3.7 10*3/uL (ref 1.4–7.7)
Neutrophils Relative %: 55.6 % (ref 43.0–77.0)
Platelets: 317 10*3/uL (ref 150.0–400.0)
RBC: 4.51 Mil/uL (ref 3.87–5.11)
RDW: 12.9 % (ref 11.5–15.5)
WBC: 6.6 10*3/uL (ref 4.0–10.5)

## 2018-12-19 LAB — BASIC METABOLIC PANEL
BUN: 16 mg/dL (ref 6–23)
CO2: 29 mEq/L (ref 19–32)
Calcium: 8.9 mg/dL (ref 8.4–10.5)
Chloride: 103 mEq/L (ref 96–112)
Creatinine, Ser: 0.93 mg/dL (ref 0.40–1.20)
GFR: 63.01 mL/min (ref >60.00)
Glucose, Bld: 96 mg/dL (ref 70–99)
Potassium: 4.2 mEq/L (ref 3.5–5.1)
Sodium: 140 mEq/L (ref 135–145)

## 2018-12-19 LAB — VITAMIN B12: Vitamin B-12: 519 pg/mL (ref 211–911)

## 2018-12-25 ENCOUNTER — Ambulatory Visit (INDEPENDENT_AMBULATORY_CARE_PROVIDER_SITE_OTHER): Payer: BC Managed Care – PPO | Admitting: Family Medicine

## 2018-12-25 ENCOUNTER — Other Ambulatory Visit: Payer: Self-pay

## 2018-12-25 ENCOUNTER — Encounter: Payer: Self-pay | Admitting: Family Medicine

## 2018-12-25 VITALS — BP 120/80 | HR 72 | Temp 98.0°F | Ht 70.0 in | Wt 233.6 lb

## 2018-12-25 DIAGNOSIS — D510 Vitamin B12 deficiency anemia due to intrinsic factor deficiency: Secondary | ICD-10-CM

## 2018-12-25 DIAGNOSIS — Z0000 Encounter for general adult medical examination without abnormal findings: Secondary | ICD-10-CM

## 2018-12-25 DIAGNOSIS — N926 Irregular menstruation, unspecified: Secondary | ICD-10-CM

## 2018-12-25 DIAGNOSIS — Z7189 Other specified counseling: Secondary | ICD-10-CM

## 2018-12-25 MED ORDER — SYRINGE (DISPOSABLE) 1 ML MISC: 1 refills | Status: DC

## 2018-12-25 MED ORDER — CYANOCOBALAMIN 1000 MCG/ML IJ SOLN: 1000.0000 ug | INTRAMUSCULAR | 3 refills | Status: DC

## 2018-12-25 NOTE — Patient Instructions (Signed)
Take care.  Glad to see you.  Update me as needed.  Thanks for your effort.  I would get a flu shot each fall.

## 2018-12-25 NOTE — Progress Notes (Signed)
CPE- See plan.  Routine anticipatory guidance given to patient.  See health maintenance.  The possibility exists that previously documented standard health maintenance information may have been brought forward from a previous encounter into this note.  If needed, that same information has been updated to reflect the current situation based on today's encounter.    Tetanus 2013 Flu done 2019 PNA and shingles not due. Pap not due, d/w pt.  Mammogram 2020 DXA not due, d/w pt.  Colonoscopy 08/2016 with 5 year f/u.   Living will d/w pt. Would have her mother designated if patient were incapacitated.  Diet and exercise d/w pt.   HIV screening likely done with prenatal labs in 1995.   She is off OCPs but now with irregular menses, usually every ~2 months.  She had one period for 2 weeks but that resolved.    Compliant with B12 replacement.  Labs discussed with patient.  CBC and B12 normal.  PMH and SH reviewed  Meds, vitals, and allergies reviewed.   ROS: Per HPI.  Unless specifically indicated otherwise in HPI, the patient denies:  General: fever. Eyes: acute vision changes ENT: sore throat Cardiovascular: chest pain Respiratory: SOB GI: vomiting GU: dysuria Musculoskeletal: acute back pain Derm: acute rash Neuro: acute motor dysfunction Psych: worsening mood Endocrine: polydipsia Heme: bleeding Allergy: hayfever  GEN: nad, alert and oriented HEENT: ncat NECK: supple w/o LA CV: rrr. PULM: ctab, no inc wob ABD: soft, +bs EXT: no edema SKIN: no acute rash

## 2018-12-26 DIAGNOSIS — Z7189 Other specified counseling: Secondary | ICD-10-CM | POA: Insufficient documentation

## 2018-12-26 NOTE — Assessment & Plan Note (Signed)
Compliant with B12 replacement.  Labs discussed with patient.  CBC and B12 normal.

## 2018-12-26 NOTE — Assessment & Plan Note (Signed)
Living will d/w pt.  Would have her mother designated if patient were incapacitated.   

## 2018-12-26 NOTE — Assessment & Plan Note (Signed)
Tetanus 2013 Flu done 2019 PNA and shingles not due. Pap not due, d/w pt.  Mammogram 2020 DXA not due, d/w pt.  Colonoscopy 08/2016 with 5 year f/u.   Living will d/w pt. Would have her mother designated if patient were incapacitated.  Diet and exercise d/w pt.   HIV screening likely done with prenatal labs in 1995.

## 2018-12-26 NOTE — Assessment & Plan Note (Signed)
She is likely perimenopausal and I would expect her periods to eventually cease.  She does not have any alarming or emergent symptoms at this point.  Her hemoglobin is still normal.  We agreed to observe for now and she will update me as needed.

## 2018-12-31 ENCOUNTER — Encounter: Payer: Self-pay | Admitting: Family Medicine

## 2019-01-17 NOTE — Telephone Encounter (Signed)
Left message for patient to call back about completed form and also sending mychart message.

## 2019-03-21 ENCOUNTER — Ambulatory Visit (INDEPENDENT_AMBULATORY_CARE_PROVIDER_SITE_OTHER): Payer: BC Managed Care – PPO

## 2019-03-21 DIAGNOSIS — Z23 Encounter for immunization: Secondary | ICD-10-CM

## 2019-06-11 ENCOUNTER — Other Ambulatory Visit: Payer: Self-pay | Admitting: Family Medicine

## 2019-06-11 DIAGNOSIS — Z1231 Encounter for screening mammogram for malignant neoplasm of breast: Secondary | ICD-10-CM

## 2019-07-01 ENCOUNTER — Ambulatory Visit
Admission: RE | Admit: 2019-07-01 | Discharge: 2019-07-01 | Disposition: A | Discharge status: Home / Self Care | Payer: BC Managed Care – PPO | Source: Ambulatory Visit | Attending: Family Medicine | Admitting: Family Medicine

## 2019-07-01 DIAGNOSIS — Z1231 Encounter for screening mammogram for malignant neoplasm of breast: Secondary | ICD-10-CM

## 2019-12-28 ENCOUNTER — Other Ambulatory Visit: Payer: Self-pay | Admitting: Family Medicine

## 2019-12-30 NOTE — Telephone Encounter (Signed)
Electronic refill request. Vitamin B12 Last office visit:   12/25/2018  No upcoming appt scheduled Last Filled:    3 mL 3 12/25/2018  Please advise.

## 2019-12-31 NOTE — Telephone Encounter (Signed)
Sent. Thanks.  Needs yearly visit scheduled when possible.

## 2019-12-31 NOTE — Telephone Encounter (Signed)
Letter mailed

## 2020-01-19 ENCOUNTER — Other Ambulatory Visit: Payer: Self-pay | Admitting: Family Medicine

## 2020-01-19 DIAGNOSIS — D510 Vitamin B12 deficiency anemia due to intrinsic factor deficiency: Secondary | ICD-10-CM

## 2020-01-19 DIAGNOSIS — Z13220 Encounter for screening for lipoid disorders: Secondary | ICD-10-CM

## 2020-01-20 ENCOUNTER — Other Ambulatory Visit: Payer: Self-pay

## 2020-01-20 ENCOUNTER — Other Ambulatory Visit (INDEPENDENT_AMBULATORY_CARE_PROVIDER_SITE_OTHER): Payer: BC Managed Care – PPO

## 2020-01-20 DIAGNOSIS — Z13220 Encounter for screening for lipoid disorders: Secondary | ICD-10-CM | POA: Diagnosis not present

## 2020-01-20 DIAGNOSIS — D510 Vitamin B12 deficiency anemia due to intrinsic factor deficiency: Secondary | ICD-10-CM | POA: Diagnosis not present

## 2020-01-20 LAB — BASIC METABOLIC PANEL
BUN: 10 mg/dL (ref 6–23)
CO2: 25 mEq/L (ref 19–32)
Calcium: 9 mg/dL (ref 8.4–10.5)
Chloride: 104 mEq/L (ref 96–112)
Creatinine, Ser: 0.98 mg/dL (ref 0.40–1.20)
GFR: 59.07 mL/min — ABNORMAL LOW (ref >60.00)
Glucose, Bld: 89 mg/dL (ref 70–99)
Potassium: 4.4 mEq/L (ref 3.5–5.1)
Sodium: 139 mEq/L (ref 135–145)

## 2020-01-20 LAB — CBC WITH DIFFERENTIAL/PLATELET
Basophils Absolute: 0.1 10*3/uL (ref 0.0–0.1)
Basophils Relative: 1.1 % (ref 0.0–3.0)
Eosinophils Absolute: 0.1 10*3/uL (ref 0.0–0.7)
Eosinophils Relative: 1.3 % (ref 0.0–5.0)
HCT: 34.1 % — ABNORMAL LOW (ref 36.0–46.0)
Hemoglobin: 10.9 g/dL — ABNORMAL LOW (ref 12.0–15.0)
Lymphocytes Relative: 33.7 % (ref 12.0–46.0)
Lymphs Abs: 2.1 10*3/uL (ref 0.7–4.0)
MCHC: 32.1 g/dL (ref 30.0–36.0)
MCV: 78.2 fl (ref 78.0–100.0)
Monocytes Absolute: 0.4 10*3/uL (ref 0.1–1.0)
Monocytes Relative: 6.8 % (ref 3.0–12.0)
Neutro Abs: 3.5 10*3/uL (ref 1.4–7.7)
Neutrophils Relative %: 57.1 % (ref 43.0–77.0)
Platelets: 331 10*3/uL (ref 150.0–400.0)
RBC: 4.36 Mil/uL (ref 3.87–5.11)
RDW: 15.9 % — ABNORMAL HIGH (ref 11.5–15.5)
WBC: 6.2 10*3/uL (ref 4.0–10.5)

## 2020-01-20 LAB — VITAMIN B12: Vitamin B-12: 298 pg/mL (ref 211–911)

## 2020-01-20 LAB — LIPID PANEL
Cholesterol: 166 mg/dL (ref 0–200)
HDL: 55.9 mg/dL (ref >39.00)
LDL Cholesterol: 97 mg/dL (ref 0–99)
NonHDL: 110.18
Total CHOL/HDL Ratio: 3
Triglycerides: 66 mg/dL (ref 0.0–149.0)
VLDL: 13.2 mg/dL (ref 0.0–40.0)

## 2020-01-23 ENCOUNTER — Encounter: Payer: Self-pay | Admitting: Family Medicine

## 2020-01-23 ENCOUNTER — Other Ambulatory Visit: Payer: Self-pay

## 2020-01-23 ENCOUNTER — Ambulatory Visit (INDEPENDENT_AMBULATORY_CARE_PROVIDER_SITE_OTHER): Payer: BC Managed Care – PPO | Admitting: Family Medicine

## 2020-01-23 VITALS — BP 122/78 | HR 99 | Temp 96.5°F | Ht 69.5 in | Wt 228.1 lb

## 2020-01-23 DIAGNOSIS — G5710 Meralgia paresthetica, unspecified lower limb: Secondary | ICD-10-CM

## 2020-01-23 DIAGNOSIS — D649 Anemia, unspecified: Secondary | ICD-10-CM

## 2020-01-23 DIAGNOSIS — Z0000 Encounter for general adult medical examination without abnormal findings: Secondary | ICD-10-CM | POA: Diagnosis not present

## 2020-01-23 DIAGNOSIS — Z7189 Other specified counseling: Secondary | ICD-10-CM

## 2020-01-23 DIAGNOSIS — M25519 Pain in unspecified shoulder: Secondary | ICD-10-CM

## 2020-01-23 DIAGNOSIS — D510 Vitamin B12 deficiency anemia due to intrinsic factor deficiency: Secondary | ICD-10-CM

## 2020-01-23 MED ORDER — CYANOCOBALAMIN 1000 MCG/ML IJ SOLN: 1000.0000 ug | INTRAMUSCULAR | 3 refills | Status: DC

## 2020-01-23 MED ORDER — SYRINGE (DISPOSABLE) 1 ML MISC: 2 refills | Status: DC

## 2020-01-23 NOTE — Progress Notes (Signed)
This visit occurred during the SARS-CoV-2 public health emergency.  Safety protocols were in place, including screening questions prior to the visit, additional usage of staff PPE, and extensive cleaning of exam room while observing appropriate contact time as indicated for disinfecting solutions.  CPE- See plan.  Routine anticipatory guidance given to patient.  See health maintenance.  The possibility exists that previously documented standard health maintenance information may have been brought forward from a previous encounter into this note.  If needed, that same information has been updated to reflect the current situation based on today's encounter.    Tetanus 2013 Flu done 2020 PNA and shingles not due. covid vaccine 2021 Pap not due, d/w pt.  Mammogram 2021 DXA not due, d/w pt.  Colonoscopy 08/2016 with 5 year f/u.  Living will d/w pt. Would have her mother designated if patient were incapacitated.  Diet and exercise d/w pt.  HIV screening prev done.   B12 def.  D/w pt.  Compliant.  Lower HGB.  No blood in stool.  No FCNAVD.  No abd pain.  Colonoscopy up to date.  She still has heavy menses.    She has R sided meralgia paresthetica.  Intermittent.  D/w pt. no weakness.  No foot drop.  No left-sided symptoms.  She has right shoulder pain.  Pain sleeping on side at night.  Some pain with range of motion.  No trauma.  PMH and SH reviewed  Meds, vitals, and allergies reviewed.   ROS: Per HPI.  Unless specifically indicated otherwise in HPI, the patient denies:  General: fever. Eyes: acute vision changes ENT: sore throat Cardiovascular: chest pain Respiratory: SOB GI: vomiting GU: dysuria Musculoskeletal: acute back pain Derm: acute rash Neuro: acute motor dysfunction Psych: worsening mood Endocrine: polydipsia Heme: bleeding Allergy: hayfever  GEN: nad, alert and oriented HEENT: ncat NECK: supple w/o LA CV: rrr. PULM: ctab, no inc wob ABD: soft, +bs EXT: no  edema SKIN: no acute rash No weakness in the lower extremities but she does have paresthesia on the right leg in the distribution of the lateral femoral cutaneous nerve. Right shoulder with normal range of motion except for pain on internal rotation that improves with scapular manipulation.  She does not have AC pain on testing.  No arm drop.

## 2020-01-23 NOTE — Patient Instructions (Signed)
Meralgia paresthetica- benign condition.   Use the shoulder exercises and let me know if that isn't helping.   Go to the lab on the way out.   If you have mychart we'll likely use that to update you.     Change B12 to every 14 days.   Plan on recheck labs in about 3 months at a nonfasting lab visit.   Take care.  Glad to see you.

## 2020-01-24 LAB — IRON: Iron: 22 ug/dL — ABNORMAL LOW (ref 42–145)

## 2020-01-27 ENCOUNTER — Encounter: Payer: Self-pay | Admitting: Family Medicine

## 2020-01-27 ENCOUNTER — Other Ambulatory Visit: Payer: Self-pay | Admitting: Family Medicine

## 2020-01-27 DIAGNOSIS — G5710 Meralgia paresthetica, unspecified lower limb: Secondary | ICD-10-CM | POA: Insufficient documentation

## 2020-01-27 DIAGNOSIS — M25519 Pain in unspecified shoulder: Secondary | ICD-10-CM | POA: Insufficient documentation

## 2020-01-27 DIAGNOSIS — D509 Iron deficiency anemia, unspecified: Secondary | ICD-10-CM | POA: Insufficient documentation

## 2020-01-27 DIAGNOSIS — D649 Anemia, unspecified: Secondary | ICD-10-CM

## 2020-01-27 DIAGNOSIS — D510 Vitamin B12 deficiency anemia due to intrinsic factor deficiency: Secondary | ICD-10-CM

## 2020-01-27 MED ORDER — FERROUS SULFATE 325 (65 FE) MG PO TABS: 325.0000 mg | ORAL_TABLET | Freq: Every day | ORAL | Status: DC

## 2020-01-27 NOTE — Assessment & Plan Note (Signed)
Reassured.  She will update me as needed.

## 2020-01-27 NOTE — Assessment & Plan Note (Signed)
Tetanus 2013 Flu done 2020 PNA and shingles not due. covid vaccine 2021 Pap not due, d/w pt.  Mammogram 2021 DXA not due, d/w pt.  Colonoscopy 08/2016 with 5 year f/u.  Living will d/w pt. Would have her mother designated if patient were incapacitated.  Diet and exercise d/w pt.  HIV screening prev done.

## 2020-01-27 NOTE — Assessment & Plan Note (Signed)
Living will d/w pt.  Would have her mother designated if patient were incapacitated.   

## 2020-01-27 NOTE — Assessment & Plan Note (Signed)
Likely rotator cuff strain.  Anatomy and home exercise program discussed with patient.  Exercises demonstrated and handout given to patient.  She will work on that and then update me as needed.

## 2020-01-27 NOTE — Assessment & Plan Note (Addendum)
B12 def.  D/w pt.  Compliant.  Lower HGB.  No blood in stool.  No FCNAVD.  No abd pain.  Colonoscopy up to date.  She still has heavy menses.   Reasonable to check iron level.  See notes on labs.  Reasonable to change B12 to every 14 days.

## 2020-02-10 ENCOUNTER — Telehealth: Payer: Self-pay | Admitting: Family Medicine

## 2020-02-10 NOTE — Telephone Encounter (Signed)
Pt called wanting to schedule b12 injection.  She stated her school nurse was doing this for her.   is ok to schedule in office nurse visit. She stated she gets these every 2 weeks.  Her last on 8/17.  Ok to schedule?

## 2020-02-11 NOTE — Telephone Encounter (Signed)
Please schedule RN visits q14 days.  Thanks.

## 2020-04-08 ENCOUNTER — Encounter: Payer: Self-pay | Admitting: Family Medicine

## 2020-04-10 ENCOUNTER — Other Ambulatory Visit: Payer: Self-pay | Admitting: Family Medicine

## 2020-04-10 DIAGNOSIS — N924 Excessive bleeding in the premenopausal period: Secondary | ICD-10-CM

## 2020-04-27 ENCOUNTER — Ambulatory Visit (INDEPENDENT_AMBULATORY_CARE_PROVIDER_SITE_OTHER): Payer: BC Managed Care – PPO | Admitting: Obstetrics and Gynecology

## 2020-04-27 ENCOUNTER — Other Ambulatory Visit (HOSPITAL_COMMUNITY)
Admission: RE | Admit: 2020-04-27 | Discharge: 2020-04-27 | Disposition: A | Discharge status: Home / Self Care | Payer: BC Managed Care – PPO | Source: Ambulatory Visit | Attending: Obstetrics and Gynecology | Admitting: Obstetrics and Gynecology

## 2020-04-27 ENCOUNTER — Other Ambulatory Visit: Payer: Self-pay

## 2020-04-27 ENCOUNTER — Encounter: Payer: Self-pay | Admitting: Obstetrics and Gynecology

## 2020-04-27 VITALS — BP 126/74 | Ht 70.0 in | Wt 221.0 lb

## 2020-04-27 DIAGNOSIS — Z1151 Encounter for screening for human papillomavirus (HPV): Secondary | ICD-10-CM | POA: Diagnosis not present

## 2020-04-27 DIAGNOSIS — N921 Excessive and frequent menstruation with irregular cycle: Secondary | ICD-10-CM | POA: Insufficient documentation

## 2020-04-27 DIAGNOSIS — D261 Other benign neoplasm of corpus uteri: Secondary | ICD-10-CM | POA: Diagnosis not present

## 2020-04-27 NOTE — Progress Notes (Addendum)
Obstetrics & Gynecology Office Visit   Chief Complaint  Patient presents with  . Menstrual Problem   The patient is seen in referral at the request of Tonia Ghent, MD from [facility] for heavy, irregular menses.   History of Present Illness: 54 y.o. [id number] female who is seen in referral from Tonia Ghent, MD from [facility] for heavy, irregular menses.  She had been taking combined oral contraceptive pills until about 2 years ago.  She and her PCP stopped the medication to see if menopause would start.  Since that time she has had heavy, irregular periods that come ranging every 2 weeks to 1.5 months.  The periods last anywhere from a couple of days to two weeks.  The menses are heavy, soaking a pad every 30 minutes with the passage of clots.  Her last pap smear was 4 years ago and it was normal.  Her CBC in August showed iron deficiency anemia (hgb 10.9, hct 34.1) for which she takes iron sulfate.  She has had no imaging.  She can soak a tampon and overnight pad.  She denies weight changes apart her weight going up a little.  She states that her hair has been falling out "by the handfuls." Denies new constipation.  Denies early satiety.  Denies bloating.   Last mammogram was 06/2019 and was BiRads 1 Last colonoscopy was 09-16-2016: results were some polyps and follow up in 5 years.    Past Medical History:  Diagnosis Date  . Anemia    pernicious anemia (b12)  . B12 deficiency     Past Surgical History:  Procedure Laterality Date  . BREAST BIOPSY Right 09-16-08   benign  . broken ankle  16-Sep-2009   broken tibia right, Dr. Mauri Pole  . VAGINAL DELIVERY    . WISDOM TOOTH EXTRACTION      Gynecologic History: Patient's last menstrual period was 04/14/2020.   Family History  Problem Relation Age of Onset  . Parkinson's disease Father   . Pernicious anemia Father   . Cancer Maternal Aunt        ovarian  . Colon cancer Neg Hx   . Breast cancer Neg Hx     Social  History   Socioeconomic History  . Marital status: Widowed    Spouse name: Not on file  . Number of children: Not on file  . Years of education: Not on file  . Highest education level: Not on file  Occupational History  . Not on file  Tobacco Use  . Smoking status: Never Smoker  . Smokeless tobacco: Never Used  Substance and Sexual Activity  . Alcohol use: Yes    Comment: once every couple of weeks  . Drug use: No  . Sexual activity: Not on file  Other Topics Concern  . Not on file  Social History Narrative   Lives in [place]. 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   From [employer] grad   Diet - regular   Exercise - walking outside   Widowed September 16, 2005, husband died in [place]   1 daughter, living in [place] Strain:   . Difficulty of Paying Living Expenses: Not on file  Food Insecurity:   . Worried About Charity fundraiser in the Last Year: Not on file  . Ran Out of Food in the Last Year: Not on  file  Transportation Needs:   . Film/video editor (Medical): Not on file  . Lack of Transportation (Non-Medical): Not on file  Physical Activity:   . Days of Exercise per Week: Not on file  . Minutes of Exercise per Session: Not on file  Stress:   . Feeling of Stress : Not on file  Social Connections:   . Frequency of Communication with Friends and Family: Not on file  . Frequency of Social Gatherings with Friends and Family: Not on file  . Attends Religious Services: Not on file  . Active Member of Clubs or Organizations: Not on file  . Attends Archivist Meetings: Not on file  . Marital Status: Not on file  Intimate Partner Violence:   . Fear of Current or Ex-Partner: Not on file  . Emotionally Abused: Not on file  . Physically Abused: Not on file  . Sexually Abused: Not on file   Allergies: No Known Allergies  Prior to Admission  medications   Medication Sig Start Date End Date Taking? Authorizing Provider  calcium-vitamin D (OSCAL WITH D) 500-200 MG-UNIT per tablet Take 1 tablet by mouth daily.   Yes [provider]  cyanocobalamin (,VITAMIN B-12,) 1000 MCG/ML injection Inject 1 mL (1,000 mcg total) into the muscle every 14 (fourteen) days. 01/23/20  Yes Tonia Ghent, MD  ferrous sulfate 325 (65 FE) MG tablet Take 1 tablet (325 mg total) by mouth daily with breakfast. 01/27/20  Yes Tonia Ghent, MD  Multiple Vitamin (MULTIVITAMIN) tablet Take 1 tablet by mouth daily.   Yes [provider]  Omega-3 Fatty Acids (FISH OIL) 1200 MG CAPS Take 2 capsules by mouth daily.   Yes [provider]  Syringe, Disposable, 1 ML MISC Use to administer B12 injections, dispense with 1" 25g needle 01/23/20  Yes Tonia Ghent, MD    Review of Systems  Constitutional: Negative.   HENT: Negative.   Eyes: Negative.   Respiratory: Negative.   Cardiovascular: Negative.   Gastrointestinal: Negative.   Genitourinary: Negative.   Musculoskeletal: Negative.   Skin: Negative.   Neurological: Negative.   Psychiatric/Behavioral: Negative.      Physical Exam BP 126/74   Ht 5\' 10"  (1.778 m)   Wt 221 lb (100.2 kg)   LMP 04/14/2020   BMI 31.71 kg/m  Patient's last menstrual period was 04/14/2020. Physical Exam Constitutional:      General: She is not in acute distress.    Appearance: Normal appearance. She is well-developed.  Genitourinary:     Pelvic exam was performed with patient in the lithotomy position.     Vulva, inguinal canal, urethra, bladder, vagina, uterus, right adnexa and left adnexa normal.     No posterior fourchette tenderness, injury or lesion present.     No cervical friability, lesion, bleeding or polyp.  HENT:     Head: Normocephalic and atraumatic.  Eyes:     General: No scleral icterus.    Conjunctiva/sclera: Conjunctivae normal.  Cardiovascular:     Rate and Rhythm: Normal  rate and regular rhythm.     Heart sounds: No murmur heard.  No friction rub. No gallop.   Pulmonary:     Effort: Pulmonary effort is normal. No respiratory distress.     Breath sounds: Normal breath sounds. No wheezing or rales.  Abdominal:     General: Bowel sounds are normal. There is no distension.     Palpations: Abdomen is soft. There is no mass.  Tenderness: There is no abdominal tenderness. There is no guarding or rebound.  Musculoskeletal:        General: Normal range of motion.     Cervical back: Normal range of motion and neck supple.  Neurological:     General: No focal deficit present.     Mental Status: She is alert and oriented to person, place, and time.     Cranial Nerves: No cranial nerve deficit.  Skin:    General: Skin is warm and dry.     Findings: No erythema.  Psychiatric:        Mood and Affect: Mood normal.        Behavior: Behavior normal.        Judgment: Judgment normal.   Endometrial Biopsy After discussion with the patient regarding her abnormal uterine bleeding I recommended that she proceed with an endometrial biopsy for further diagnosis. The risks, benefits, alternatives, and indications for an endometrial biopsy were discussed with the patient in detail. She understood the risks including infection, bleeding, cervical laceration and uterine perforation.  Verbal consent was obtained.   PROCEDURE NOTE:  Pipelle endometrial biopsy was performed using aseptic technique with iodine preparation.  The uterus was sounded to a length of 7.5 cm.  Adequate sampling was obtained with minimal blood loss.  The patient tolerated the procedure well.  Disposition will be pending pathology.   Female chaperone present for pelvic and breast  portions of the physical exam  Assessment: 54 y.o. No obstetric history on file. female here for  1. Menorrhagia with irregular cycle      Plan: Problem List Items Addressed This Visit    None    Visit Diagnoses     Menorrhagia with irregular cycle    -  Primary   Relevant Orders   US PELVIS TRANSVAGINAL NON-OB (TV ONLY)   Cytology - PAP   Surgical pathology     Discussed management options for abnormal uterine bleeding including expectant, NSAIDs, tranexamic acid (Lysteda), oral progesterone (Provera, norethindrone, megace), Depo Provera, Levonorgestrel containing IUD, endometrial ablation (Novasure) or hysterectomy as definitive surgical management.  Discussed risks and benefits of each method.   Final management decision will hinge on results of patient's work up and whether an underlying etiology for the patients bleeding symptoms can be discerned.  We will conduct a basic work up examining using the PALM-COIEN classification system.   Pap smear and endometrial biopsy performed today. Will get pelvic ultrasound to assess structural issues. No obvious non-structural issues noted today.   A total of 30 minutes were spent face-to-face with the patient as well as preparation, review, communication, and documentation during this encounter.    Prentice Docker, MD 04/27/2020 12:00 PM    CC: Tonia Ghent, MD [address] [place],  [place] [id number]

## 2020-04-29 LAB — SURGICAL PATHOLOGY

## 2020-05-01 LAB — CYTOLOGY - PAP
Comment: NEGATIVE
Diagnosis: NEGATIVE
High risk HPV: NEGATIVE

## 2020-05-11 ENCOUNTER — Encounter: Payer: Self-pay | Admitting: Obstetrics and Gynecology

## 2020-05-11 ENCOUNTER — Other Ambulatory Visit: Payer: Self-pay

## 2020-05-11 ENCOUNTER — Ambulatory Visit (INDEPENDENT_AMBULATORY_CARE_PROVIDER_SITE_OTHER): Payer: BC Managed Care – PPO | Admitting: Obstetrics and Gynecology

## 2020-05-11 ENCOUNTER — Ambulatory Visit (INDEPENDENT_AMBULATORY_CARE_PROVIDER_SITE_OTHER): Payer: BC Managed Care – PPO

## 2020-05-11 VITALS — BP 122/74 | Ht 70.0 in | Wt 220.0 lb

## 2020-05-11 DIAGNOSIS — N921 Excessive and frequent menstruation with irregular cycle: Secondary | ICD-10-CM

## 2020-05-11 NOTE — Progress Notes (Signed)
Gynecology Ultrasound Follow Up   Chief Complaint  Patient presents with  . Follow-up  Pelvic ultrasound for heavy, irregular menstrual bleeding   History of Present Illness: Patient is a 54 y.o. female who presents today for ultrasound evaluation of the above .  Ultrasound demonstrates the following findings Adnexa: small left, mildly complex ovarian cyst measuring 1.8 x 1.3 x 1.9 cm Uterus: anteverted with endometrial stripe  5.7 mm Additional: no free fluid in cul-de-sac.  No obvious polyps or other explanations for her heavy menstrual bleeding.  Pap 04/27/2020: NILM, HPV negative Endometrial biopsy: Proliferative phase endometrium, no hyperplasia or carcinoma.    Past Medical History:  Diagnosis Date  . Anemia    pernicious anemia (b12)  . B12 deficiency     Past Surgical History:  Procedure Laterality Date  . BREAST BIOPSY Right Sep 19, 2008   benign  . broken ankle  09-19-09   broken tibia right, Dr. Mauri Pole  . VAGINAL DELIVERY    . WISDOM TOOTH EXTRACTION       Family History  Problem Relation Age of Onset  . Parkinson's disease Father   . Pernicious anemia Father   . Cancer Maternal Aunt        ovarian  . Colon cancer Neg Hx   . Breast cancer Neg Hx     Social History   Socioeconomic History  . Marital status: Widowed    Spouse name: Not on file  . Number of children: Not on file  . Years of education: Not on file  . Highest education level: Not on file  Occupational History  . Not on file  Tobacco Use  . Smoking status: Never Smoker  . Smokeless tobacco: Never Used  Substance and Sexual Activity  . Alcohol use: Yes    Comment: once every couple of weeks  . Drug use: No  . Sexual activity: Not on file  Other Topics Concern  . Not on file  Social History Narrative   Lives in [place]. 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   From [employer] [facility] grad   Diet - regular   Exercise - walking  outside   Widowed 09/19/05, husband died in [place]   1 daughter, living in [place] Strain:   . Difficulty of Paying Living Expenses: Not on file  Food Insecurity:   . Worried About Charity fundraiser in the Last Year: Not on file  . Ran Out of Food in the Last Year: Not on file  Transportation Needs:   . Lack of Transportation (Medical): Not on file  . Lack of Transportation (Non-Medical): Not on file  Physical Activity:   . Days of Exercise per Week: Not on file  . Minutes of Exercise per Session: Not on file  Stress:   . Feeling of Stress : Not on file  Social Connections:   . Frequency of Communication with Friends and Family: Not on file  . Frequency of Social Gatherings with Friends and Family: Not on file  . Attends Religious Services: Not on file  . Active Member of Clubs or Organizations: Not on file  . Attends Archivist Meetings: Not on file  . Marital Status: Not on file  Intimate Partner Violence:   . Fear of Current or Ex-Partner: Not on file  . Emotionally Abused: Not on file  . Physically Abused: Not on file  .  Sexually Abused: Not on file    No Known Allergies  Prior to Admission medications   Medication Sig Start Date End Date Taking? Authorizing Provider  calcium-vitamin D (OSCAL WITH D) 500-200 MG-UNIT per tablet Take 1 tablet by mouth daily.    [provider]  cyanocobalamin (,VITAMIN B-12,) 1000 MCG/ML injection Inject 1 mL (1,000 mcg total) into the muscle every 14 (fourteen) days. 01/23/20   Tonia Ghent, MD  ferrous sulfate 325 (65 FE) MG tablet Take 1 tablet (325 mg total) by mouth daily with breakfast. 01/27/20   Tonia Ghent, MD  Multiple Vitamin (MULTIVITAMIN) tablet Take 1 tablet by mouth daily.    [provider]  Omega-3 Fatty Acids (FISH OIL) 1200 MG CAPS Take 2 capsules by mouth daily.    [provider]  Syringe, Disposable, 1 ML MISC Use to  administer B12 injections, dispense with 1" 25g needle 01/23/20   Tonia Ghent, MD    Physical Exam BP 122/74   Ht 5\' 10"  (1.778 m)   Wt 220 lb (99.8 kg)   LMP 04/14/2020   BMI 31.57 kg/m    General: NAD HEENT: normocephalic, anicteric Pulmonary: No increased work of breathing Extremities: no edema, erythema, or tenderness Neurologic: Grossly intact, normal gait Psychiatric: mood appropriate, affect full  Imaging Results US PELVIS TRANSVAGINAL NON-OB (TV ONLY)  Result Date: 05/11/2020 Patient Name: Jessica Bennett DOB: Sep 02, 1965 MRN: [id number] ULTRASOUND REPORT Location: [facility] Date of Service: 05/11/2020 Indications: Menorrhagia with irregular cycles Findings: The uterus is anteverted and measures 9.7 x 5.6 x 4.6 cm. Echo texture is heterogenous without evidence of focal masses. The Endometrium measures 5.7 mm. Right Ovary measures 3.0 x 1.4 x 1.2 cm. It is normal in appearance. Left Ovary measures 2.9 x 2.0 x 1.9 cm. There is a complex cyst, no blood flow, in the left ovary. The cyst measures 17.6 x 12.8 x 19.4 mm. Survey of the adnexa demonstrates no adnexal masses. There is no free fluid in the cul de sac. Impression: 1. The uterus is heterogeneous but no obvious fibroids are seen. 2. Normal appearing right ovary. 3.  There is a small, complex cyst in the left ovary. Gweneth Dimitri, RT The ultrasound images and findings were reviewed by me and I agree with the above report. Prentice Docker, MD, [facility], [facility] 05/11/2020 2:04 PM       Assessment: 54 y.o. No obstetric history on file.  1. Menorrhagia with irregular cycle      Plan: Problem List Items Addressed This Visit    None    Visit Diagnoses    Menorrhagia with irregular cycle    -  Primary      After reviewing options for treatment, including; do nothing at this time (clinical observation), treatment with medications (IUD, Lysteda. She does not want to go back on  combined OCPs), and surgery with either hysteroscopy, D&C, endometrial ablation and surgery with hysterectomy, she elects hysteroscopy, D&C, and endometrial ablation. Will schedule for as soon as I can with the holidays coming up.   A total of 21 minutes were spent face-to-face with the patient as well as preparation, review, communication, and documentation during this encounter.     Prentice Docker, MD, [facility], [facility] 05/11/2020 5:47 PM

## 2020-05-11 NOTE — H&P (View-Only) (Signed)
Gynecology Ultrasound Follow Up   Chief Complaint  Patient presents with  . Follow-up  Pelvic ultrasound for heavy, irregular menstrual bleeding   History of Present Illness: Patient is a 54 y.o. female who presents today for ultrasound evaluation of the above .  Ultrasound demonstrates the following findings Adnexa: small left, mildly complex ovarian cyst measuring 1.8 x 1.3 x 1.9 cm Uterus: anteverted with endometrial stripe  5.7 mm Additional: no free fluid in cul-de-sac.  No obvious polyps or other explanations for her heavy menstrual bleeding.  Pap 04/27/2020: NILM, HPV negative Endometrial biopsy: Proliferative phase endometrium, no hyperplasia or carcinoma.    Past Medical History:  Diagnosis Date  . Anemia    pernicious anemia (b12)  . B12 deficiency     Past Surgical History:  Procedure Laterality Date  . BREAST BIOPSY Right 09-06-08   benign  . broken ankle  09-06-09   broken tibia right, Dr. Mauri Pole  . VAGINAL DELIVERY    . WISDOM TOOTH EXTRACTION       Family History  Problem Relation Age of Onset  . Parkinson's disease Father   . Pernicious anemia Father   . Cancer Maternal Aunt        ovarian  . Colon cancer Neg Hx   . Breast cancer Neg Hx     Social History   Socioeconomic History  . Marital status: Widowed    Spouse name: Not on file  . Number of children: Not on file  . Years of education: Not on file  . Highest education level: Not on file  Occupational History  . Not on file  Tobacco Use  . Smoking status: Never Smoker  . Smokeless tobacco: Never Used  Substance and Sexual Activity  . Alcohol use: Yes    Comment: once every couple of weeks  . Drug use: No  . Sexual activity: Not on file  Other Topics Concern  . Not on file  Social History Narrative   Lives in [place]. 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   From [facility] grad   Diet - regular   Exercise - walking  outside   Widowed 09-06-2005, husband died in [place]   1 daughter, living in [place] Strain:   . Difficulty of Paying Living Expenses: Not on file  Food Insecurity:   . Worried About Charity fundraiser in the Last Year: Not on file  . Ran Out of Food in the Last Year: Not on file  Transportation Needs:   . Lack of Transportation (Medical): Not on file  . Lack of Transportation (Non-Medical): Not on file  Physical Activity:   . Days of Exercise per Week: Not on file  . Minutes of Exercise per Session: Not on file  Stress:   . Feeling of Stress : Not on file  Social Connections:   . Frequency of Communication with Friends and Family: Not on file  . Frequency of Social Gatherings with Friends and Family: Not on file  . Attends Religious Services: Not on file  . Active Member of Clubs or Organizations: Not on file  . Attends Archivist Meetings: Not on file  . Marital Status: Not on file  Intimate Partner Violence:   . Fear of Current or Ex-Partner: Not on file  . Emotionally Abused: Not on file  . Physically Abused: Not on file  .  Sexually Abused: Not on file    No Known Allergies  Prior to Admission medications   Medication Sig Start Date End Date Taking? Authorizing Provider  calcium-vitamin D (OSCAL WITH D) 500-200 MG-UNIT per tablet Take 1 tablet by mouth daily.    [provider]  cyanocobalamin (,VITAMIN B-12,) 1000 MCG/ML injection Inject 1 mL (1,000 mcg total) into the muscle every 14 (fourteen) days. 01/23/20   Tonia Ghent, MD  ferrous sulfate 325 (65 FE) MG tablet Take 1 tablet (325 mg total) by mouth daily with breakfast. 01/27/20   Tonia Ghent, MD  Multiple Vitamin (MULTIVITAMIN) tablet Take 1 tablet by mouth daily.    [provider]  Omega-3 Fatty Acids (FISH OIL) 1200 MG CAPS Take 2 capsules by mouth daily.    [provider]  Syringe, Disposable, 1 ML MISC Use to  administer B12 injections, dispense with 1" 25g needle 01/23/20   Tonia Ghent, MD    Physical Exam BP 122/74   Ht 5\' 10"  (1.778 m)   Wt 220 lb (99.8 kg)   LMP 04/14/2020   BMI 31.57 kg/m    General: NAD HEENT: normocephalic, anicteric Pulmonary: No increased work of breathing Extremities: no edema, erythema, or tenderness Neurologic: Grossly intact, normal gait Psychiatric: mood appropriate, affect full  Imaging Results US PELVIS TRANSVAGINAL NON-OB (TV ONLY)  Result Date: 05/11/2020 Patient Name: Jessica Bennett DOB: Jun 26, 1965 MRN: [id number] ULTRASOUND REPORT Location: [facility] Date of Service: 05/11/2020 Indications: Menorrhagia with irregular cycles Findings: The uterus is anteverted and measures 9.7 x 5.6 x 4.6 cm. Echo texture is heterogenous without evidence of focal masses. The Endometrium measures 5.7 mm. Right Ovary measures 3.0 x 1.4 x 1.2 cm. It is normal in appearance. Left Ovary measures 2.9 x 2.0 x 1.9 cm. There is a complex cyst, no blood flow, in the left ovary. The cyst measures 17.6 x 12.8 x 19.4 mm. Survey of the adnexa demonstrates no adnexal masses. There is no free fluid in the cul de sac. Impression: 1. The uterus is heterogeneous but no obvious fibroids are seen. 2. Normal appearing right ovary. 3.  There is a small, complex cyst in the left ovary. Gweneth Dimitri, RT The ultrasound images and findings were reviewed by me and I agree with the above report. Prentice Docker, MD, [facility], [facility] 05/11/2020 2:04 PM       Assessment: 54 y.o. No obstetric history on file.  1. Menorrhagia with irregular cycle      Plan: Problem List Items Addressed This Visit    None    Visit Diagnoses    Menorrhagia with irregular cycle    -  Primary      After reviewing options for treatment, including; do nothing at this time (clinical observation), treatment with medications (IUD, Lysteda. She does not want to go back on  combined OCPs), and surgery with either hysteroscopy, D&C, endometrial ablation and surgery with hysterectomy, she elects hysteroscopy, D&C, and endometrial ablation. Will schedule for as soon as I can with the holidays coming up.   A total of 21 minutes were spent face-to-face with the patient as well as preparation, review, communication, and documentation during this encounter.     Prentice Docker, MD, [facility], [facility] 05/11/2020 5:47 PM

## 2020-05-13 ENCOUNTER — Telehealth: Payer: Self-pay | Admitting: Obstetrics and Gynecology

## 2020-05-13 ENCOUNTER — Encounter: Payer: Self-pay | Admitting: Obstetrics and Gynecology

## 2020-05-13 NOTE — Telephone Encounter (Signed)
-----   Message from Will Bonnet, MD sent at 05/11/2020  2:35 PM EST ----- Regarding: Schedule surgery Surgery Booking Request Patient Full Name:  Jessica Bennett  MRN: [id number]  DOB: August 11, 1965  Surgeon: Prentice Docker, MD  Requested Surgery Date and Time: ASAP Primary Diagnosis AND Code: Menorrhagia with irregular cycle [N92.1] Secondary Diagnosis and Code:  Surgical Procedure: Hysteroscopy D&C with Ablation (Minerva) RNFA Requested?: No L&D Notification: No Admission Status: same day surgery Length of Surgery: 50 min Special Case Needs: Yes  (Minverva with Rep if possible) H&P: No Phone Interview???:  Yes Interpreter: No Medical Clearance:  No Special Scheduling Instructions: No Any known health/anesthesia issues, diabetes, sleep apnea, latex allergy, defibrillator/pacemaker?: No Acuity: P3   (P1 highest, P2 delay may cause harm, P3 low, elective gyn, P4 lowest)

## 2020-05-13 NOTE — Telephone Encounter (Signed)
Called patient to schedule Hysteroscopy D&C w ablation using Minerva w Kendra Opitz 12/9  H&P N/A  Covid testing 12/7 @ 8-10:30, [address], drive up and wear mask. Advised pt to quarantine until DOS.  Pre-admit phone call appointment to be requested - date and time will be included on H&P paper work. Also all appointments will be updated on pt MyChart. Explained that this appointment has a call window. Based on the time scheduled will indicate if the call will be received within a 4 hour window before 1:00 or after.  Advised that pt may also receive calls from the hospital pharmacy and pre-service center.  Confirmed pt has BCBS as Chartered certified accountant. No secondary insurance.  Vena Rua is confirmed for this date.

## 2020-05-18 ENCOUNTER — Other Ambulatory Visit: Payer: Self-pay | Admitting: Family Medicine

## 2020-05-18 DIAGNOSIS — Z1231 Encounter for screening mammogram for malignant neoplasm of breast: Secondary | ICD-10-CM

## 2020-05-20 ENCOUNTER — Encounter
Admission: RE | Admit: 2020-05-20 | Discharge: 2020-05-20 | Disposition: A | Discharge status: Home / Self Care | Payer: BC Managed Care – PPO | Source: Ambulatory Visit | Attending: Obstetrics and Gynecology | Admitting: Obstetrics and Gynecology

## 2020-05-20 ENCOUNTER — Other Ambulatory Visit: Payer: Self-pay

## 2020-05-20 ENCOUNTER — Other Ambulatory Visit
Admission: RE | Admit: 2020-05-20 | Discharge: 2020-05-20 | Disposition: A | Discharge status: Home / Self Care | Payer: BC Managed Care – PPO | Source: Ambulatory Visit | Attending: Obstetrics and Gynecology | Admitting: Obstetrics and Gynecology

## 2020-05-20 DIAGNOSIS — N840 Polyp of corpus uteri: Secondary | ICD-10-CM | POA: Diagnosis not present

## 2020-05-20 DIAGNOSIS — Z8349 Family history of other endocrine, nutritional and metabolic diseases: Secondary | ICD-10-CM | POA: Diagnosis not present

## 2020-05-20 DIAGNOSIS — Z20822 Contact with and (suspected) exposure to covid-19: Secondary | ICD-10-CM | POA: Insufficient documentation

## 2020-05-20 DIAGNOSIS — Z01812 Encounter for preprocedural laboratory examination: Secondary | ICD-10-CM | POA: Insufficient documentation

## 2020-05-20 DIAGNOSIS — N921 Excessive and frequent menstruation with irregular cycle: Secondary | ICD-10-CM | POA: Diagnosis present

## 2020-05-20 DIAGNOSIS — Z832 Family history of diseases of the blood and blood-forming organs and certain disorders involving the immune mechanism: Secondary | ICD-10-CM | POA: Diagnosis not present

## 2020-05-20 LAB — SARS CORONAVIRUS 2 (TAT 6-24 HRS): SARS Coronavirus 2: NEGATIVE

## 2020-05-20 NOTE — Patient Instructions (Signed)
Your procedure is scheduled on: Thursday May 21, 2020  Report to the Registration Desk on the 1st floor of the [facility]. To find out your arrival time, please call [phone] between 1PM - 3PM on: Wednesday May 20, 2020  REMEMBER: Instructions that are not followed completely may result in serious medical risk, up to and including death; or upon the discretion of your surgeon and anesthesiologist your surgery may need to be rescheduled.  Do not eat food after midnight the night before surgery.  No gum chewing, lozengers or hard candies.  You may however, drink CLEAR liquids up to 2 hours before you are scheduled to arrive for your surgery. Do not drink anything within 2 hours of your scheduled arrival time.  Clear liquids include: - water  - apple juice without pulp - gatorade (not RED, PURPLE, OR BLUE) - black coffee or tea (Do NOT add milk or creamers to the coffee or tea) Do NOT drink anything that is not on this list.  Type 1 and Type 2 diabetics should only drink water.    TAKE THESE MEDICATIONS THE MORNING OF SURGERY WITH A SIP OF WATER: NONE  One week prior to surgery: Stop Anti-inflammatories (NSAIDS) such as Advil, Aleve, Ibuprofen, Motrin, Naproxen, Naprosyn and ASPIRIN OR Aspirin based products such as Excedrin, Goodys Powder, BC Powder. Stop ANY OVER THE COUNTER supplements until after surgery. (However, you may continue taking Vitamin D, Vitamin B, and multivitamin up until the day before surgery.)  No Alcohol for 24 hours before or after surgery.  No Smoking including e-cigarettes for 24 hours prior to surgery.  No chewable tobacco products for at least 6 hours prior to surgery.  No nicotine patches on the day of surgery.  Do not use any "recreational" drugs for at least a week prior to your surgery.  Please be advised that the combination of cocaine and anesthesia may have negative outcomes, up to and including death. If you test positive for  cocaine, your surgery will be cancelled.  On the morning of surgery brush your teeth with toothpaste and water, you may rinse your mouth with mouthwash if you wish. Do not swallow any toothpaste or mouthwash.  Do not wear jewelry, make-up, hairpins, clips or nail polish.  Do not wear lotions, powders, or perfumes.   Do not shave body from the neck down 48 hours prior to surgery just in case you cut yourself which could leave a site for infection.  Also, freshly shaved skin may become irritated if using the CHG soap.  Contact lenses, hearing aids and dentures may not be worn into surgery.  Do not bring valuables to the hospital. [facility] is not responsible for any missing/lost belongings or valuables.   SHOWER MORNING OF SURGERY  Notify your doctor if there is any change in your medical condition (cold, fever, infection).  Wear comfortable clothing (specific to your surgery type) to the hospital.  Plan for stool softeners for home use; pain medications have a tendency to cause constipation. You can also help prevent constipation by eating foods high in fiber such as fruits and vegetables and drinking plenty of fluids as your diet allows.  After surgery, you can help prevent lung complications by doing breathing exercises.  Take deep breaths and cough every 1-2 hours. Your doctor may order a device called an Incentive Spirometer to help you take deep breaths. When coughing or sneezing, hold a pillow firmly against your incision with both hands. This is  called "splinting." Doing this helps protect your incision. It also decreases belly discomfort.  If you are being discharged the day of surgery, you will not be allowed to drive home. You will need a responsible adult (18 years or older) to drive you home and stay with you that night.   Please call the [facility] at [phone] if you have any questions about these instructions.  Visitation Policy:  Patients  undergoing a surgery or procedure may have one family member or support person with them as long as that person is not COVID-19 positive or experiencing its symptoms.  That person may remain in the waiting area during the procedure.  Inpatient Visitation Update:   In an effort to ensure the safety of our team members and our patients, we are implementing a change to our visitation policy:  Effective Monday, Aug. 9, at 7 a.m., inpatients will be allowed one support person.  o The support person may change daily.  o The support person must pass our screening, gel in and out, and wear a mask at all times, including in the patient's room.  o Patients must also wear a mask when staff or their support person are in the room.  o Masking is required regardless of vaccination status.  Systemwide, no visitors 17 or younger.

## 2020-05-21 ENCOUNTER — Ambulatory Visit: Payer: BC Managed Care – PPO | Admitting: Anesthesiology

## 2020-05-21 ENCOUNTER — Encounter
Admission: RE | Disposition: A | Discharge status: Home / Self Care | Payer: Self-pay | Source: Home / Self Care | Attending: Obstetrics and Gynecology

## 2020-05-21 ENCOUNTER — Other Ambulatory Visit: Payer: Self-pay

## 2020-05-21 ENCOUNTER — Encounter: Payer: Self-pay | Admitting: Obstetrics and Gynecology

## 2020-05-21 ENCOUNTER — Ambulatory Visit
Admission: RE | Admit: 2020-05-21 | Discharge: 2020-05-21 | Disposition: A | Discharge status: Home / Self Care | Payer: BC Managed Care – PPO | Attending: Obstetrics and Gynecology | Admitting: Obstetrics and Gynecology

## 2020-05-21 DIAGNOSIS — Z832 Family history of diseases of the blood and blood-forming organs and certain disorders involving the immune mechanism: Secondary | ICD-10-CM | POA: Insufficient documentation

## 2020-05-21 DIAGNOSIS — N921 Excessive and frequent menstruation with irregular cycle: Secondary | ICD-10-CM | POA: Diagnosis present

## 2020-05-21 DIAGNOSIS — N840 Polyp of corpus uteri: Secondary | ICD-10-CM | POA: Clinically undetermined

## 2020-05-21 DIAGNOSIS — Z20822 Contact with and (suspected) exposure to covid-19: Secondary | ICD-10-CM | POA: Insufficient documentation

## 2020-05-21 DIAGNOSIS — Z8349 Family history of other endocrine, nutritional and metabolic diseases: Secondary | ICD-10-CM | POA: Insufficient documentation

## 2020-05-21 HISTORY — PX: HYSTEROSCOPY WITH D & C: SHX1775

## 2020-05-21 LAB — POCT PREGNANCY, URINE: Preg Test, Ur: NEGATIVE

## 2020-05-21 SURGERY — DILATATION AND CURETTAGE /HYSTEROSCOPY
Anesthesia: General

## 2020-05-21 MED ORDER — DEXAMETHASONE SODIUM PHOSPHATE 10 MG/ML IJ SOLN
INTRAMUSCULAR | Status: DC | PRN
  Administered 2020-05-21: 10 mg via INTRAVENOUS

## 2020-05-21 MED ORDER — HYDROCODONE-ACETAMINOPHEN 5-325 MG PO TABS: 1.0000 | ORAL_TABLET | Freq: Three times a day (TID) | ORAL | 0 refills | Status: DC | PRN

## 2020-05-21 MED ORDER — FENTANYL CITRATE (PF) 100 MCG/2ML IJ SOLN: 25.0000 ug | INTRAMUSCULAR | Status: DC | PRN

## 2020-05-21 MED ORDER — PROPOFOL 10 MG/ML IV BOLUS
INTRAVENOUS | Status: DC | PRN
  Administered 2020-05-21: 50 mg via INTRAVENOUS
  Administered 2020-05-21: 150 mg via INTRAVENOUS

## 2020-05-21 MED ORDER — MIDAZOLAM HCL 2 MG/2ML IJ SOLN
INTRAMUSCULAR | Status: AC
  Filled 2020-05-21: qty 2

## 2020-05-21 MED ORDER — ORAL CARE MOUTH RINSE: 15.0000 mL | Freq: Once | OROMUCOSAL | Status: AC

## 2020-05-21 MED ORDER — PROPOFOL 10 MG/ML IV BOLUS
INTRAVENOUS | Status: AC
  Filled 2020-05-21: qty 20

## 2020-05-21 MED ORDER — ONDANSETRON HCL 4 MG/2ML IJ SOLN: 4.0000 mg | Freq: Once | INTRAMUSCULAR | Status: DC | PRN

## 2020-05-21 MED ORDER — CHLORHEXIDINE GLUCONATE 0.12 % MT SOLN: 15.0000 mL | Freq: Once | OROMUCOSAL | Status: AC

## 2020-05-21 MED ORDER — FENTANYL CITRATE (PF) 100 MCG/2ML IJ SOLN
INTRAMUSCULAR | Status: AC
  Filled 2020-05-21: qty 2

## 2020-05-21 MED ORDER — SILVER NITRATE-POT NITRATE 75-25 % EX MISC
CUTANEOUS | Status: DC | PRN
  Administered 2020-05-21: 2 via TOPICAL

## 2020-05-21 MED ORDER — LIDOCAINE HCL (CARDIAC) PF 100 MG/5ML IV SOSY
PREFILLED_SYRINGE | INTRAVENOUS | Status: DC | PRN
  Administered 2020-05-21: 100 mg via INTRAVENOUS

## 2020-05-21 MED ORDER — IBUPROFEN 600 MG PO TABS: 600.0000 mg | ORAL_TABLET | Freq: Four times a day (QID) | ORAL | 0 refills | Status: DC | PRN

## 2020-05-21 MED ORDER — FAMOTIDINE 20 MG PO TABS: 20.0000 mg | ORAL_TABLET | Freq: Once | ORAL | Status: AC

## 2020-05-21 MED ORDER — POVIDONE-IODINE 10 % EX SWAB
2.0000 "application " | Freq: Once | CUTANEOUS | Status: AC
  Administered 2020-05-21: 2 via TOPICAL

## 2020-05-21 MED ORDER — PROPOFOL 500 MG/50ML IV EMUL
INTRAVENOUS | Status: DC | PRN
  Administered 2020-05-21: 150 ug/kg/min via INTRAVENOUS

## 2020-05-21 MED ORDER — KETOROLAC TROMETHAMINE 30 MG/ML IJ SOLN
INTRAMUSCULAR | Status: DC | PRN
  Administered 2020-05-21: 30 mg via INTRAVENOUS

## 2020-05-21 MED ORDER — FAMOTIDINE 20 MG PO TABS
ORAL_TABLET | ORAL | Status: AC
  Administered 2020-05-21: 20 mg via ORAL
  Filled 2020-05-21: qty 1

## 2020-05-21 MED ORDER — LACTATED RINGERS IV SOLN: INTRAVENOUS | Status: DC

## 2020-05-21 MED ORDER — FENTANYL CITRATE (PF) 100 MCG/2ML IJ SOLN
INTRAMUSCULAR | Status: DC | PRN
  Administered 2020-05-21 (×4): 25 ug via INTRAVENOUS

## 2020-05-21 MED ORDER — CHLORHEXIDINE GLUCONATE 0.12 % MT SOLN
OROMUCOSAL | Status: AC
  Administered 2020-05-21: 15 mL via OROMUCOSAL
  Filled 2020-05-21: qty 15

## 2020-05-21 MED ORDER — MIDAZOLAM HCL 2 MG/2ML IJ SOLN
INTRAMUSCULAR | Status: DC | PRN
  Administered 2020-05-21: 2 mg via INTRAVENOUS

## 2020-05-21 MED ORDER — ONDANSETRON HCL 4 MG/2ML IJ SOLN
INTRAMUSCULAR | Status: DC | PRN
  Administered 2020-05-21: 4 mg via INTRAVENOUS

## 2020-05-21 SURGICAL SUPPLY — 27 items
BAG DRN RND TRDRP ANRFLXCHMBR (UROLOGICAL SUPPLIES)
BAG URINE DRAIN 2000ML AR STRL (UROLOGICAL SUPPLIES) IMPLANT
CATH FOLEY 2WAY  5CC 16FR (CATHETERS)
CATH FOLEY 2WAY 5CC 16FR (CATHETERS)
CATH ROBINSON RED A/P 16FR (CATHETERS) ×2 IMPLANT
CATH URTH 16FR FL 2W BLN LF (CATHETERS) IMPLANT
COVER WAND RF STERILE (DRAPES) ×2 IMPLANT
DEVICE MYOSURE LITE (MISCELLANEOUS) ×2 IMPLANT
DEVICE MYOSURE REACH (MISCELLANEOUS) ×1 IMPLANT
ELECT REM PT RETURN 9FT ADLT (ELECTROSURGICAL) ×2
ELECTRODE REM PT RTRN 9FT ADLT (ELECTROSURGICAL) ×1 IMPLANT
GLOVE BIO SURGEON STRL SZ7 (GLOVE) ×2 IMPLANT
GLOVE BIOGEL PI IND STRL 7.5 (GLOVE) ×1 IMPLANT
GLOVE BIOGEL PI INDICATOR 7.5 (GLOVE) ×1
GOWN STRL REUS W/ TWL LRG LVL3 (GOWN DISPOSABLE) ×2 IMPLANT
GOWN STRL REUS W/TWL LRG LVL3 (GOWN DISPOSABLE) ×4
HANDPIECE ABLA MINERVA ENDO (MISCELLANEOUS) ×1 IMPLANT
KIT PROCEDURE FLUENT (KITS) ×1 IMPLANT
KIT TURNOVER CYSTO (KITS) ×2 IMPLANT
MANIFOLD NEPTUNE II (INSTRUMENTS) ×2 IMPLANT
PACK DNC HYST (MISCELLANEOUS) ×2 IMPLANT
PAD OB MATERNITY 4.3X12.25 (PERSONAL CARE ITEMS) ×2 IMPLANT
PAD PREP 24X41 OB/GYN DISP (PERSONAL CARE ITEMS) ×2 IMPLANT
SOL .9 NS 3000ML IRR  AL (IV SOLUTION) ×1
SOL .9 NS 3000ML IRR AL (IV SOLUTION) ×1
SOL .9 NS 3000ML IRR UROMATIC (IV SOLUTION) ×1 IMPLANT
TUBING CONNECTING 10 (TUBING) ×2 IMPLANT

## 2020-05-21 NOTE — Anesthesia Procedure Notes (Signed)
Procedure Name: LMA Insertion Date/Time: 05/21/2020 3:41 PM Performed by: Tollie Eth, CRNA Pre-anesthesia Checklist: Patient identified, Patient being monitored, Timeout performed, Emergency Drugs available and Suction available Patient Re-evaluated:Patient Re-evaluated prior to induction Oxygen Delivery Method: Circle system utilized Preoxygenation: Pre-oxygenation with 100% oxygen Induction Type: IV induction Ventilation: Mask ventilation without difficulty LMA: LMA inserted LMA Size: 3.0 Tube type: Oral Number of attempts: 1 Placement Confirmation: positive ETCO2 and breath sounds checked- equal and bilateral Tube secured with: Tape Dental Injury: Teeth and Oropharynx as per pre-operative assessment

## 2020-05-21 NOTE — Interval H&P Note (Signed)
History and Physical Interval Note:  05/21/2020 3:30 PM  Jessica Bennett  has presented today for surgery, with the diagnosis of Menorrhagia w irregular cycle N92.1.  The various methods of treatment have been discussed with the patient and family. After consideration of risks, benefits and other options for treatment, the patient has consented to  Procedure(s): DILATATION AND CURETTAGE /HYSTEROSCOPY WITH ENDOMETRIAL ABLATION as a surgical intervention.  The patient's history has been reviewed, patient examined, no change in status, stable for surgery.  I have reviewed the patient's chart and labs.  Questions were answered to the patient's satisfaction.  She has been examined at her preop and at her visit on 04/27/2020.  No changes noted.  Prentice Docker, MD, [facility], [facility] 05/21/2020 3:31 PM

## 2020-05-21 NOTE — Transfer of Care (Signed)
Immediate Anesthesia Transfer of Care Note  Patient: Jessica Bennett  Procedure(s) Performed: DILATATION AND CURETTAGE /HYSTEROSCOPY WITH MINERVA (N/A )  Patient Location: PACU  Anesthesia Type:General  Level of Consciousness: drowsy  Airway & Oxygen Therapy: Patient Spontanous Breathing and Patient connected to face mask oxygen  Post-op Assessment: Report given to RN and Post -op Vital signs reviewed and stable  Post vital signs: Reviewed and stable  Last Vitals:  Vitals Value Taken Time  BP 140/63 05/21/20 1642  Temp    Pulse    Resp 12 05/21/20 1643  SpO2    Vitals shown include unvalidated device data.  Last Pain:  Vitals:   05/21/20 1244  PainSc: 0-No pain         Complications: No complications documented.

## 2020-05-21 NOTE — Discharge Instructions (Signed)

## 2020-05-21 NOTE — Anesthesia Preprocedure Evaluation (Signed)
Anesthesia Evaluation  Patient identified by MRN, date of birth, ID band Patient awake    Reviewed: Allergy & Precautions, NPO status , Patient's Chart, lab work & pertinent test results  History of Anesthesia Complications (+) PONV and history of anesthetic complications  Airway Mallampati: II       Dental   Pulmonary neg sleep apnea, neg COPD, Not current smoker,           Cardiovascular (-) hypertension(-) Past MI and (-) CHF (-) dysrhythmias (-) Valvular Problems/Murmurs     Neuro/Psych neg Seizures    GI/Hepatic Neg liver ROS, neg GERD  ,  Endo/Other  neg diabetes  Renal/GU negative Renal ROS     Musculoskeletal   Abdominal   Peds  Hematology  (+) anemia ,   Anesthesia Other Findings   Reproductive/Obstetrics                             Anesthesia Physical Anesthesia Plan  ASA: I  Anesthesia Plan: General   Post-op Pain Management:    Induction: Intravenous  PONV Risk Score and Plan: 4 or greater and Ondansetron, Dexamethasone, Propofol infusion and Midazolam  Airway Management Planned: LMA  Additional Equipment:   Intra-op Plan:   Post-operative Plan:   Informed Consent: I have reviewed the patients History and Physical, chart, labs and discussed the procedure including the risks, benefits and alternatives for the proposed anesthesia with the patient or authorized representative who has indicated his/her understanding and acceptance.       Plan Discussed with:   Anesthesia Plan Comments:         Anesthesia Quick Evaluation

## 2020-05-21 NOTE — Op Note (Signed)
Operative Note   07/28/2015  PRE-OP DIAGNOSIS: Menorrhagia with irregular cycle [N92.1]  POST-OP DIAGNOSIS:  1) Menorrhagia with irregular cycle [N92.1] 2) Endometrial polyp [N84.0]  SURGEON: Will Bonnet, MD  PROCEDURE:  1) Hysteroscopy 2) Dilation and curettage 3) Endometrial ablation using Minerva   ANESTHESIA: General   ESTIMATED BLOOD LOSS: 25 mL   IV Fluid: 500 mL crystalloid  SPECIMENS: endometrial curettings with endometrial polyp  FLUID DEFICIT: min   COMPLICATIONS: none   DISPOSITION: PACU - hemodynamically stable.   CONDITION: stable   FINDINGS: Exam under anesthesia revealed small, mobile uterus with no masses and bilateral adnexa without masses or fullness. Hysteroscopy revealed focally fluffy endometrium posteriorly and right and in the bilateral cornua.  There was a small right anterior endometrial polyp (~ 7 mm).  Normal appearing bilateral tubal ostia and normal appearing endocervical canal. Findings after ablation revealed globally ablated endometrium.    PROCEDURE IN DETAIL: After informed consent was obtained, the patient was taken to the operating room where anesthesia was obtained without difficulty. The patient was positioned in the dorsal lithotomy position in [place]. The patient was examined under anesthesia, with the above noted findings. The bivalved speculum was placed inside the patient's vagina, and the the anterior lip of the cervix was seen and grasped with the tenaculum. The uterine cavity was sounded to 9 cm, and then the cervix was progressively dilated to a 7 mm using Hegar dilators. The 30 degree hysteroscope was introduced, with LR fluid used to distend the intrauterine cavity, with the above noted findings.   The Myosure device was utilized to sample the fluffy endometrium until it was flush with the rest of the uterus and the endometrial polyp was removed.  The hystersocope was removed and the uterine cavity was curetted until  a gritty texture was noted, yielding endometrial curettings.    The cervix was dilated to 8 mm.  The Minerva device was then placed without difficulty. Measurements were obtained. Patient was noted to have a uterine length of 4.5 cm, a cervical length of 4.5 cm.  The Minerva device was placed in accordance with the manufacturer's recommendations.  Once deployed the device fell within the recommended window of cavity length. The balloon was deployed.  The device was tested for seal.  Once passed, the device was activated and the length of the procedure was 120 seconds per the function of the power source of the technology. The Minerva device is then removed and repeat hysteroscopy revealed an appropriate lining of the uterus and no perforation or injury. Hysteroscope was removed with minimal discrepancy of fluid.   Tenaculum was removed with excellent hemostasis noted after application of silver nitrate to the tenaculum entry sites. After ensuring that no instruments or sponges remained, the speculum was removed.  She was then taken out of dorsal lithotomy. Hemostasis noted.  The patient tolerated the procedure well. Sponge, lap and needle counts were correct x2. The patient was taken to recovery room in excellent condition.  VTE prophylaxis: SCDs.   Prentice Docker, MD 05/21/2020 4:31 PM

## 2020-05-21 NOTE — Anesthesia Postprocedure Evaluation (Signed)
Anesthesia Post Note  Patient: Jessica Bennett  Procedure(s) Performed: DILATATION AND CURETTAGE /HYSTEROSCOPY WITH MINERVA (N/A )  Patient location during evaluation: PACU Anesthesia Type: General Level of consciousness: awake and alert Pain management: pain level controlled Vital Signs Assessment: post-procedure vital signs reviewed and stable Respiratory status: spontaneous breathing and respiratory function stable Cardiovascular status: stable Anesthetic complications: no   No complications documented.   Last Vitals:  Vitals:   05/21/20 1244 05/21/20 1642  BP: 135/82 140/63  Pulse: 98   Resp: 16 13  Temp: (!) 36.3 C 36.8 C  SpO2: 100% 100%    Last Pain:  Vitals:   05/21/20 1642  PainSc: Asleep                 Shaquavia Whisonant K

## 2020-05-22 ENCOUNTER — Encounter: Payer: Self-pay | Admitting: Obstetrics and Gynecology

## 2020-05-25 LAB — SURGICAL PATHOLOGY

## 2020-06-11 ENCOUNTER — Other Ambulatory Visit: Payer: Self-pay

## 2020-06-11 ENCOUNTER — Ambulatory Visit: Payer: BC Managed Care – PPO | Admitting: Obstetrics and Gynecology

## 2020-06-11 ENCOUNTER — Encounter: Payer: Self-pay | Admitting: Obstetrics and Gynecology

## 2020-06-11 VITALS — BP 128/78 | Ht 70.0 in | Wt 211.0 lb

## 2020-06-11 DIAGNOSIS — N921 Excessive and frequent menstruation with irregular cycle: Secondary | ICD-10-CM

## 2020-06-11 DIAGNOSIS — Z09 Encounter for follow-up examination after completed treatment for conditions other than malignant neoplasm: Secondary | ICD-10-CM

## 2020-06-11 NOTE — Progress Notes (Signed)
° °  Postoperative Follow-up Patient presents post op from hysteroscopy, dilation and curettage, endometrial ablation 3 weeks ago for menorrhagia with irregular cycle and possible endometrial polyp.  Subjective: Patient reports marked improvement in her preop symptoms. Eating a regular diet without difficulty. The patient is not having any pain.  Activity: normal activities of daily living.  She denies fevers, chills, nausea, vomiting.    Pathology Report: DIAGNOSIS:  A. ENDOMETRIUM; CURETTAGE:  - PROLIFERATIVE ENDOMETRIUM WITH MILD DISORDER.  - NEGATIVE FOR ATYPIA / EIN AND MALIGNANCY.    Objective: Vitals:   06/11/20 1146  BP: 128/78   Vital Signs: BP 128/78    Ht 5\' 10"  (1.778 m)    Wt 211 lb (95.7 kg)    BMI 30.28 kg/m  Constitutional: Well nourished, well developed female in no acute distress.  HEENT: normal Skin: Warm and dry.  Extremity: no edema    Assessment: 54 y.o. s/p above procedure progressing well  Plan: Patient has done well after surgery with no apparent complications.  I have discussed the post-operative course to date, and the expected progress moving forward.  The patient understands what complications to be concerned about.  I will see the patient in routine follow up, or sooner if needed.    Activity plan: No restriction.  Follow up for routine screening.    57, MD 06/11/2020, 12:04 PM   CC: 06/13/2020, MD [address] [place],  [place]

## 2020-06-19 ENCOUNTER — Telehealth: Payer: Self-pay | Admitting: Family Medicine

## 2020-06-19 NOTE — Telephone Encounter (Signed)
Called and left voicemail for patient to call office back to schedule follow-up labs. Lab orders in. Can make appointment as soon as possible.

## 2020-06-19 NOTE — Telephone Encounter (Signed)
Pt called in wanted to know about getting her labs recheck, she stated that dr.ducan wanted to recheck them when she last saw him

## 2020-06-22 ENCOUNTER — Other Ambulatory Visit (INDEPENDENT_AMBULATORY_CARE_PROVIDER_SITE_OTHER): Payer: BC Managed Care – PPO

## 2020-06-22 ENCOUNTER — Other Ambulatory Visit: Payer: Self-pay

## 2020-06-22 ENCOUNTER — Other Ambulatory Visit: Payer: Self-pay | Admitting: Family Medicine

## 2020-06-22 DIAGNOSIS — D649 Anemia, unspecified: Secondary | ICD-10-CM | POA: Diagnosis not present

## 2020-06-22 DIAGNOSIS — D510 Vitamin B12 deficiency anemia due to intrinsic factor deficiency: Secondary | ICD-10-CM

## 2020-06-22 LAB — CBC WITH DIFFERENTIAL/PLATELET
Basophils Absolute: 0.1 10*3/uL (ref 0.0–0.1)
Basophils Relative: 0.8 % (ref 0.0–3.0)
Eosinophils Absolute: 0.2 10*3/uL (ref 0.0–0.7)
Eosinophils Relative: 3 % (ref 0.0–5.0)
HCT: 40.9 % (ref 36.0–46.0)
Hemoglobin: 13.7 g/dL (ref 12.0–15.0)
Lymphocytes Relative: 27.6 % (ref 12.0–46.0)
Lymphs Abs: 1.8 10*3/uL (ref 0.7–4.0)
MCHC: 33.5 g/dL (ref 30.0–36.0)
MCV: 84.9 fl (ref 78.0–100.0)
Monocytes Absolute: 0.4 10*3/uL (ref 0.1–1.0)
Monocytes Relative: 6.6 % (ref 3.0–12.0)
Neutro Abs: 4.1 10*3/uL (ref 1.4–7.7)
Neutrophils Relative %: 62 % (ref 43.0–77.0)
Platelets: 292 10*3/uL (ref 150.0–400.0)
RBC: 4.81 Mil/uL (ref 3.87–5.11)
RDW: 14.5 % (ref 11.5–15.5)
WBC: 6.6 10*3/uL (ref 4.0–10.5)

## 2020-06-22 LAB — IRON: Iron: 56 ug/dL (ref 42–145)

## 2020-06-22 LAB — VITAMIN B12: Vitamin B-12: 648 pg/mL (ref 211–911)

## 2020-06-23 ENCOUNTER — Encounter: Payer: Self-pay | Admitting: Family Medicine

## 2020-06-24 ENCOUNTER — Other Ambulatory Visit: Payer: Self-pay | Admitting: Family Medicine

## 2020-06-24 DIAGNOSIS — N921 Excessive and frequent menstruation with irregular cycle: Secondary | ICD-10-CM

## 2020-06-24 DIAGNOSIS — N840 Polyp of corpus uteri: Secondary | ICD-10-CM

## 2020-06-24 MED ORDER — CYANOCOBALAMIN 1000 MCG/ML IJ SOLN: 1000.0000 ug | INTRAMUSCULAR | Status: DC

## 2020-06-30 ENCOUNTER — Other Ambulatory Visit: Payer: Self-pay | Admitting: Family Medicine

## 2020-06-30 DIAGNOSIS — N840 Polyp of corpus uteri: Secondary | ICD-10-CM

## 2020-06-30 DIAGNOSIS — N921 Excessive and frequent menstruation with irregular cycle: Secondary | ICD-10-CM

## 2020-07-06 ENCOUNTER — Other Ambulatory Visit: Payer: Self-pay

## 2020-07-06 ENCOUNTER — Ambulatory Visit
Admission: RE | Admit: 2020-07-06 | Discharge: 2020-07-06 | Disposition: A | Discharge status: Home / Self Care | Payer: BC Managed Care – PPO | Source: Ambulatory Visit | Attending: Family Medicine | Admitting: Family Medicine

## 2020-07-06 DIAGNOSIS — Z1231 Encounter for screening mammogram for malignant neoplasm of breast: Secondary | ICD-10-CM | POA: Diagnosis not present

## 2020-10-05 ENCOUNTER — Telehealth: Payer: Self-pay

## 2020-10-05 DIAGNOSIS — U071 COVID-19: Secondary | ICD-10-CM

## 2020-10-05 NOTE — Telephone Encounter (Signed)
I thank all involved.  Copy of mychart message that I sent to patient (To [facility] as FYI):  I saw your triage note from today.  I think it makes sense to keep the appointment with [facility] tomorrow.  I went ahead and sent a note to the covid treatment clinic about you.  They may be contacting you in the meantime.  You may not need specific treatment through them, but I wanted them to be aware of your situation.  I hope you feel better soon.  Take care.    Brigitte Pulse

## 2020-10-05 NOTE — Telephone Encounter (Signed)
Pt tested + for covid with PCR on 10/05/20; pt started with symptoms on 10/02/20; pt had fever 101 and chills taking tylenol but still running fever around 101. Pt has had 2 covid vaccines and one booster. Pt having some body aches with H/A pain level of 6. Dry cough with no SOB. No diarrhea or vomiting and no loss of taste or smell. S/T that is sore at pain level or 6-7(S/T started last night)no swelling in throat, pt has runny nose and head congestion. No chest congestion and no wheezing. Pt will quarantine,drink plenty of fluids, rest and continue tylenol for fever. Pt scheduled video visit on 10/06/20 at 3:20 for Jessica Bossier NP; UC & ED precatuions given and pt voiced understanding.Sending note to Jessica Fitz NP and Dr Damita Dunnings as PCP.

## 2020-10-06 ENCOUNTER — Telehealth (HOSPITAL_COMMUNITY): Payer: Self-pay

## 2020-10-06 ENCOUNTER — Telehealth (INDEPENDENT_AMBULATORY_CARE_PROVIDER_SITE_OTHER): Payer: BC Managed Care – PPO | Admitting: Primary Care

## 2020-10-06 ENCOUNTER — Other Ambulatory Visit: Payer: Self-pay

## 2020-10-06 ENCOUNTER — Encounter: Payer: Self-pay | Admitting: Primary Care

## 2020-10-06 DIAGNOSIS — U071 COVID-19: Secondary | ICD-10-CM | POA: Diagnosis not present

## 2020-10-06 NOTE — Progress Notes (Signed)
Patient ID: Jessica Bennett, female    DOB: 02/27/1966, 55 y.o.   MRN: [id number]  Virtual visit completed through [employer], a video enabled telemedicine application. Due to national recommendations of social distancing due to COVID-19, a virtual visit is felt to be most appropriate for this patient at this time. Reviewed limitations, risks, security and privacy concerns of performing a virtual visit and the availability of in person appointments. I also reviewed that there may be a patient responsible charge related to this service. The patient agreed to proceed.   Patient location: home Provider location: [place] at [facility], office Persons participating in this virtual visit: patient, provider   If any vitals were documented, they were collected by patient at home unless specified below.    Temp 99.9 F (37.7 C) (Oral)   Ht 5\' 10"  (1.778 m)   Wt 211 lb (95.7 kg)   BMI 30.28 kg/m    CC: Cough Subjective:   HPI: Jessica Bennett is a 55 y.o. female patient of Dr. Damita Dunnings with a chief compliant of cough.   She also reports nasal congestion, chills, fatigue, sore throat. Symptoms began four days ago, was tested three days ago which resulted as positive yesterday.   She's had three Covid-19 vaccines. She's taken Tylenol, Nyquil Cold and Flu with some improvement. Today she's feeling somewhat better, no fevers today and no Tylenol today.   Her PCP placed a referral to the Covid treatment team yesterday, she has yet to hear from them. She does plan on getting the 4th Covid-19 booster at some point.      Relevant past medical, surgical, family and social history reviewed and updated as indicated. Interim medical history since our last visit reviewed. Allergies and medications reviewed and updated. Outpatient Medications Prior to Visit  Medication Sig Dispense Refill  . cyanocobalamin (,VITAMIN B-12,) 1000 MCG/ML injection INJECT 1 ML (1,000 MCG TOTAL) INTO THE MUSCLE  EVERY 30 (THIRTY) DAYS. 3 mL 1  . ibuprofen (ADVIL) 600 MG tablet Take 1 tablet (600 mg total) by mouth every 6 (six) hours as needed for mild pain or cramping. 30 tablet 0  . Multiple Vitamin (MULTIVITAMIN) tablet Take 1 tablet by mouth daily.    Marland Kitchen HYDROcodone-acetaminophen (NORCO/VICODIN) 5-325 MG tablet Take 1 tablet by mouth every 8 (eight) hours as needed (breakthrough pain). 8 tablet 0   No facility-administered medications prior to visit.     Per HPI unless specifically indicated in ROS section below Review of Systems  Constitutional: Positive for fatigue. Negative for chills and fever.  HENT: Positive for congestion and sore throat.   Respiratory: Positive for cough.   Neurological: Positive for headaches.   Objective:  Temp 99.9 F (37.7 C) (Oral)   Ht 5\' 10"  (1.778 m)   Wt 211 lb (95.7 kg)   BMI 30.28 kg/m   Wt Readings from Last 3 Encounters:  10/06/20 211 lb (95.7 kg)  06/11/20 211 lb (95.7 kg)  05/20/20 210 lb (95.3 kg)       Physical exam: Gen: alert, NAD, not ill appearing Pulm: speaks in complete sentences without increased work of breathing Psych: normal mood, normal thought content      Results for orders placed or performed in visit on 06/22/20  Iron  Result Value Ref Range   Iron 56 42 - 145 ug/dL  Vitamin B12  Result Value Ref Range   Vitamin B-12 648 211 - 911 pg/mL  CBC with Differential/Platelet  Result Value Ref  Range   WBC 6.6 4.0 - 10.5 K/uL   RBC 4.81 3.87 - 5.11 Mil/uL   Hemoglobin 13.7 12.0 - 15.0 g/dL   HCT 40.9 36.0 - 46.0 %   MCV 84.9 78.0 - 100.0 fl   MCHC 33.5 30.0 - 36.0 g/dL   RDW 14.5 11.5 - 15.5 %   Platelets 292.0 150.0 - 400.0 K/uL   Neutrophils Relative % 62.0 43.0 - 77.0 %   Lymphocytes Relative 27.6 12.0 - 46.0 %   Monocytes Relative 6.6 3.0 - 12.0 %   Eosinophils Relative 3.0 0.0 - 5.0 %   Basophils Relative 0.8 0.0 - 3.0 %   Neutro Abs 4.1 1.4 - 7.7 K/uL   Lymphs Abs 1.8 0.7 - 4.0 K/uL   Monocytes Absolute 0.4  0.1 - 1.0 K/uL   Eosinophils Absolute 0.2 0.0 - 0.7 K/uL   Basophils Absolute 0.1 0.0 - 0.1 K/uL   Assessment & Plan:   Problem List Items Addressed This Visit      Other   COVID-19 virus infection    Unfortunately tested positive yesterday, three vaccines since September 2021.  Overall she appears stable, starting to feel better today. Discussed to notify us if she doesn't hear back from Covid team within 24 hours.  Dicussed return to work date of 04/28 if improving and no new symptoms.           No orders of the defined types were placed in this encounter.  No orders of the defined types were placed in this encounter.   I discussed the assessment and treatment plan with the patient. The patient was provided an opportunity to ask questions and all were answered. The patient agreed with the plan and demonstrated an understanding of the instructions. The patient was advised to call back or seek an in-person evaluation if the symptoms worsen or if the condition fails to improve as anticipated.  Follow up plan:  Monitor your temperatures as discussed.  Please notify us if you've not heard back from the Covid team within 24 hours.  It was a pleasure meeting you! Allie Bossier, NP-C   Pleas Koch, NP

## 2020-10-06 NOTE — Assessment & Plan Note (Signed)
Unfortunately tested positive yesterday, three vaccines since September 2021.  Overall she appears stable, starting to feel better today. Discussed to notify us if she doesn't hear back from Covid team within 24 hours.  Dicussed return to work date of 04/28 if improving and no new symptoms.

## 2020-10-06 NOTE — Telephone Encounter (Signed)
Called to discuss with patient about COVID-19 symptoms and the use of one of the available treatments for those with mild to moderate Covid symptoms and at a high risk of hospitalization.  Pt appears to qualify for outpatient treatment due to co-morbid conditions and/or a member of an at-risk group in accordance with the FDA Emergency Use Authorization.     Unable to reach pt - LVM 10/06/20 @ [place], RN

## 2020-10-06 NOTE — Telephone Encounter (Signed)
Noted  

## 2020-10-06 NOTE — Patient Instructions (Signed)
Monitor your temperatures as discussed.  Please notify us if you've not heard back from the Covid team within 24 hours.  It was a pleasure meeting you! Allie Bossier, NP-C

## 2020-10-08 ENCOUNTER — Telehealth: Payer: Self-pay | Admitting: Physician Assistant

## 2020-10-08 NOTE — Telephone Encounter (Signed)
Called to discuss with patient about Covid symptoms and the use of a monoclonal antibody infusion for those with mild to moderate Covid symptoms and at a high risk of hospitalization.   Pt is qualified for the monoclonal antibody infusion, but due to medication shortages we cannot offer the pt the infusion at this time.  Patient is out of window for oral antiviral drug.  Today is her last day to receive monoclonal antibody but states he is feeling great and symptoms almost resolved.  This decision was based on clinical judgement as well as the the NIH Covid 19 treatment guidelines for treatment prioritization and equitable access. Symptoms tier reviewed as well as criteria for ending isolation. Preventative practices reviewed. Patient verbalized understanding.  [place], [place]  10/08/2020 10:51 AM

## 2020-11-02 ENCOUNTER — Encounter: Payer: Self-pay | Admitting: Family Medicine

## 2020-12-11 ENCOUNTER — Other Ambulatory Visit: Payer: Self-pay | Admitting: Family Medicine

## 2020-12-11 DIAGNOSIS — N840 Polyp of corpus uteri: Secondary | ICD-10-CM

## 2020-12-11 DIAGNOSIS — N921 Excessive and frequent menstruation with irregular cycle: Secondary | ICD-10-CM

## 2021-01-03 ENCOUNTER — Other Ambulatory Visit: Payer: Self-pay | Admitting: Family Medicine

## 2021-01-03 DIAGNOSIS — D510 Vitamin B12 deficiency anemia due to intrinsic factor deficiency: Secondary | ICD-10-CM

## 2021-01-14 ENCOUNTER — Other Ambulatory Visit (INDEPENDENT_AMBULATORY_CARE_PROVIDER_SITE_OTHER): Payer: BC Managed Care – PPO

## 2021-01-14 ENCOUNTER — Other Ambulatory Visit: Payer: Self-pay

## 2021-01-14 DIAGNOSIS — D510 Vitamin B12 deficiency anemia due to intrinsic factor deficiency: Secondary | ICD-10-CM

## 2021-01-14 LAB — CBC WITH DIFFERENTIAL/PLATELET
Basophils Absolute: 0.1 10*3/uL (ref 0.0–0.1)
Basophils Relative: 1 % (ref 0.0–3.0)
Eosinophils Absolute: 0.2 10*3/uL (ref 0.0–0.7)
Eosinophils Relative: 3.8 % (ref 0.0–5.0)
HCT: 39.5 % (ref 36.0–46.0)
Hemoglobin: 13.1 g/dL (ref 12.0–15.0)
Lymphocytes Relative: 31.7 % (ref 12.0–46.0)
Lymphs Abs: 1.8 10*3/uL (ref 0.7–4.0)
MCHC: 33.2 g/dL (ref 30.0–36.0)
MCV: 86.6 fl (ref 78.0–100.0)
Monocytes Absolute: 0.5 10*3/uL (ref 0.1–1.0)
Monocytes Relative: 9.5 % (ref 3.0–12.0)
Neutro Abs: 3.1 10*3/uL (ref 1.4–7.7)
Neutrophils Relative %: 54 % (ref 43.0–77.0)
Platelets: 255 10*3/uL (ref 150.0–400.0)
RBC: 4.56 Mil/uL (ref 3.87–5.11)
RDW: 13.4 % (ref 11.5–15.5)
WBC: 5.7 10*3/uL (ref 4.0–10.5)

## 2021-01-14 LAB — COMPREHENSIVE METABOLIC PANEL
ALT: 13 U/L (ref 0–35)
AST: 16 U/L (ref 0–37)
Albumin: 4.1 g/dL (ref 3.5–5.2)
Alkaline Phosphatase: 67 U/L (ref 39–117)
BUN: 15 mg/dL (ref 6–23)
CO2: 27 mEq/L (ref 19–32)
Calcium: 9.1 mg/dL (ref 8.4–10.5)
Chloride: 106 mEq/L (ref 96–112)
Creatinine, Ser: 1.07 mg/dL (ref 0.40–1.20)
GFR: 58.56 mL/min — ABNORMAL LOW (ref >60.00)
Glucose, Bld: 98 mg/dL (ref 70–99)
Potassium: 4.1 mEq/L (ref 3.5–5.1)
Sodium: 142 mEq/L (ref 135–145)
Total Bilirubin: 0.3 mg/dL (ref 0.2–1.2)
Total Protein: 7.1 g/dL (ref 6.0–8.3)

## 2021-01-14 LAB — VITAMIN B12: Vitamin B-12: 440 pg/mL (ref 211–911)

## 2021-01-14 LAB — IRON: Iron: 44 ug/dL (ref 42–145)

## 2021-01-21 ENCOUNTER — Ambulatory Visit (INDEPENDENT_AMBULATORY_CARE_PROVIDER_SITE_OTHER): Payer: BC Managed Care – PPO | Admitting: Family Medicine

## 2021-01-21 ENCOUNTER — Other Ambulatory Visit: Payer: Self-pay

## 2021-01-21 ENCOUNTER — Encounter: Payer: Self-pay | Admitting: Family Medicine

## 2021-01-21 VITALS — BP 108/72 | HR 80 | Temp 97.5°F | Ht 70.0 in | Wt 230.0 lb

## 2021-01-21 DIAGNOSIS — Z7189 Other specified counseling: Secondary | ICD-10-CM

## 2021-01-21 DIAGNOSIS — N840 Polyp of corpus uteri: Secondary | ICD-10-CM

## 2021-01-21 DIAGNOSIS — N921 Excessive and frequent menstruation with irregular cycle: Secondary | ICD-10-CM

## 2021-01-21 DIAGNOSIS — D510 Vitamin B12 deficiency anemia due to intrinsic factor deficiency: Secondary | ICD-10-CM

## 2021-01-21 DIAGNOSIS — Z0000 Encounter for general adult medical examination without abnormal findings: Secondary | ICD-10-CM | POA: Diagnosis not present

## 2021-01-21 DIAGNOSIS — Z8742 Personal history of other diseases of the female genital tract: Secondary | ICD-10-CM

## 2021-01-21 MED ORDER — SYRINGE (DISPOSABLE) 1 ML MISC: 2 refills | Status: DC

## 2021-01-21 MED ORDER — CYANOCOBALAMIN 1000 MCG/ML IJ SOLN: INTRAMUSCULAR | 3 refills | Status: DC

## 2021-01-21 NOTE — Patient Instructions (Addendum)
Check with your insurance to see if they will cover the shingles shot. I would get a flu shot each fall.   Take care.  Glad to see you. Update me as needed.

## 2021-01-21 NOTE — Progress Notes (Signed)
This visit occurred during the SARS-CoV-2 public health emergency.  Safety protocols were in place, including screening questions prior to the visit, additional usage of staff PPE, and extensive cleaning of exam room while observing appropriate contact time as indicated for disinfecting solutions.  CPE- See plan.  Routine anticipatory guidance given to patient.  See health maintenance.  The possibility exists that previously documented standard health maintenance information may have been brought forward from a previous encounter into this note.  If needed, that same information has been updated to reflect the current situation based on today's encounter.    Tetanus 2013 Flu done yearly PNA and shingles d/w pt.   covid vaccine 2021 Pap not due, d/w pt.   Mammogram 2022 DXA not due, d/w pt.  Colonoscopy 08/2016 with 5 year f/u.   Living will d/w pt.  Would have her mother designated if patient were incapacitated.   Diet and exercise d/w pt.   HIV screening prev done.   B12 def.  H/o anemia.  Labs d/w pt.  HGB wnl.    PMH and SH reviewed  Meds, vitals, and allergies reviewed.   ROS: Per HPI.  Unless specifically indicated otherwise in HPI, the patient denies:  General: fever. Eyes: acute vision changes ENT: sore throat Cardiovascular: chest pain Respiratory: SOB GI: vomiting GU: dysuria Musculoskeletal: acute back pain Derm: acute rash Neuro: acute motor dysfunction Psych: worsening mood Endocrine: polydipsia Heme: bleeding Allergy: hayfever  GEN: nad, alert and oriented HEENT: ncat NECK: supple w/o LA CV: rrr. PULM: ctab, no inc wob ABD: soft, +bs EXT: no edema SKIN: Well-perfused.

## 2021-01-24 NOTE — Assessment & Plan Note (Signed)
Tetanus 2013 Flu done yearly PNA and shingles d/w pt.   covid vaccine 2021 Pap not due, d/w pt.   Mammogram 2022 DXA not due, d/w pt.  Colonoscopy 08/2016 with 5 year f/u.   Living will d/w pt.  Would have her mother designated if patient were incapacitated.   Diet and exercise d/w pt.   HIV screening prev done.

## 2021-01-24 NOTE — Assessment & Plan Note (Signed)
Living will d/w pt.  Would have her mother designated if patient were incapacitated.

## 2021-01-24 NOTE — Assessment & Plan Note (Signed)
B12 def.  H/o anemia.  Labs d/w pt.  HGB wnl.  Continue B12 replacement.

## 2021-04-06 ENCOUNTER — Ambulatory Visit
Admission: RE | Admit: 2021-04-06 | Discharge: 2021-04-06 | Disposition: A | Discharge status: Home / Self Care | Payer: BC Managed Care – PPO | Source: Ambulatory Visit | Attending: Urgent Care | Admitting: Urgent Care

## 2021-04-06 ENCOUNTER — Other Ambulatory Visit: Payer: Self-pay

## 2021-04-06 VITALS — BP 136/76 | HR 86 | Temp 98.2°F

## 2021-04-06 DIAGNOSIS — J018 Other acute sinusitis: Secondary | ICD-10-CM | POA: Diagnosis not present

## 2021-04-06 MED ORDER — PSEUDOEPHEDRINE HCL 30 MG PO TABS: 30.0000 mg | ORAL_TABLET | Freq: Three times a day (TID) | ORAL | 0 refills | Status: DC | PRN

## 2021-04-06 MED ORDER — AMOXICILLIN 875 MG PO TABS: 875.0000 mg | ORAL_TABLET | Freq: Two times a day (BID) | ORAL | 0 refills | Status: DC

## 2021-04-06 MED ORDER — PROMETHAZINE-DM 6.25-15 MG/5ML PO SYRP: 5.0000 mL | ORAL_SOLUTION | Freq: Every evening | ORAL | 0 refills | Status: DC | PRN

## 2021-04-06 MED ORDER — BENZONATATE 100 MG PO CAPS: 100.0000 mg | ORAL_CAPSULE | Freq: Three times a day (TID) | ORAL | 0 refills | Status: DC | PRN

## 2021-04-06 MED ORDER — CETIRIZINE HCL 10 MG PO TABS: 10.0000 mg | ORAL_TABLET | Freq: Every day | ORAL | 0 refills | Status: DC

## 2021-04-06 NOTE — ED Triage Notes (Signed)
Pt c/o sinus pressure/pain, ST, left ear pain and cough x 2 weeks

## 2021-04-06 NOTE — ED Provider Notes (Signed)
Renae Gloss   MRN: [id number] DOB: May 19, 1966  Subjective:   Jessica Bennett is a 55 y.o. female presenting for 2-week history of persistent coughing, throat pain, runny and stuffy nose, left-sided ear pain, left-sided facial pressure.  No chest pain, shortness of breath or wheezing.  Patient has been using over-the-counter medications without relief.  No history of asthma, smoking.  No current facility-administered medications for this encounter.  Current Outpatient Medications:    cyanocobalamin (,VITAMIN B-12,) 1000 MCG/ML injection, INJECT 1 ML (1,000 MCG TOTAL) INTO THE MUSCLE EVERY 30 (THIRTY) DAYS., Disp: 3 mL, Rfl: 3   Multiple Vitamin (MULTIVITAMIN) tablet, Take 1 tablet by mouth daily., Disp: , Rfl:    Syringe, Disposable, 1 ML MISC, Use to administer B12 injections, dispense with 1" 25g needle.  Hold for patient to request, Disp: 25 each, Rfl: 2   No Known Allergies  Past Medical History:  Diagnosis Date   Anemia    pernicious anemia (b12)   B12 deficiency    Family history of ovarian cancer      Past Surgical History:  Procedure Laterality Date   BREAST BIOPSY Right 2010   benign   broken ankle  2011   broken tibia right, Dr. Mauri Pole   HYSTEROSCOPY WITH D & C N/A 05/21/2020   Procedure: DILATATION AND CURETTAGE /HYSTEROSCOPY WITH MINERVA;  Surgeon: Will Bonnet, MD;  Location: [facility] ORS;  Service: Gynecology;  Laterality: N/A;   VAGINAL DELIVERY     WISDOM TOOTH EXTRACTION      Family History  Problem Relation Age of Onset   Parkinson's disease Father    Pernicious anemia Father    Ovarian cancer Maternal Aunt 60   Colon cancer Neg Hx    Breast cancer Neg Hx     Social History   Tobacco Use   Smoking status: Never   Smokeless tobacco: Never  Vaping Use   Vaping Use: Never used  Substance Use Topics   Alcohol use: Yes    Comment: once every couple of weeks   Drug use: No    ROS   Objective:   Vitals: BP 136/76 (BP  Location: Right Arm)   Pulse 86   Temp 98.2 F (36.8 C) (Oral)   SpO2 97%   Physical Exam Constitutional:      General: She is not in acute distress.    Appearance: Normal appearance. She is well-developed. She is not ill-appearing, toxic-appearing or diaphoretic.  HENT:     Head: Normocephalic and atraumatic.     Right Ear: Tympanic membrane, ear canal and external ear normal. No drainage or tenderness. No middle ear effusion. Tympanic membrane is not erythematous.     Left Ear: Tympanic membrane, ear canal and external ear normal. No drainage or tenderness.  No middle ear effusion. Tympanic membrane is not erythematous.     Nose: No congestion or rhinorrhea.     Mouth/Throat:     Mouth: Mucous membranes are moist. No oral lesions.     Pharynx: No pharyngeal swelling, oropharyngeal exudate, posterior oropharyngeal erythema or uvula swelling.     Tonsils: No tonsillar exudate or tonsillar abscesses.  Eyes:     Extraocular Movements: Extraocular movements intact.     Right eye: Normal extraocular motion.     Left eye: Normal extraocular motion.     Conjunctiva/sclera: Conjunctivae normal.     Pupils: Pupils are equal, round, and reactive to light.  Cardiovascular:     Rate and Rhythm: Normal rate  and regular rhythm.     Pulses: Normal pulses.     Heart sounds: Normal heart sounds. No murmur heard.   No friction rub. No gallop.  Pulmonary:     Effort: Pulmonary effort is normal. No respiratory distress.     Breath sounds: Normal breath sounds. No stridor. No wheezing, rhonchi or rales.  Musculoskeletal:     Cervical back: Normal range of motion and neck supple.  Lymphadenopathy:     Cervical: No cervical adenopathy.  Skin:    General: Skin is warm and dry.     Findings: No rash.  Neurological:     General: No focal deficit present.     Mental Status: She is alert and oriented to person, place, and time.  Psychiatric:        Mood and Affect: Mood normal.        Behavior:  Behavior normal.        Thought Content: Thought content normal.    Assessment and Plan :   PDMP not reviewed this encounter.  1. Acute non-recurrent sinusitis of other sinus     Deferred imaging given clear cardiopulmonary exam, hemodynamically stable vital signs. Will start empiric treatment for sinusitis with amoxicillin.  Recommended supportive care otherwise including the use of oral antihistamine, decongestant. Counseled patient on potential for adverse effects with medications prescribed/recommended today, ER and return-to-clinic precautions discussed, patient verbalized understanding.    Jaynee Eagles, PA-C 04/06/21 1439

## 2021-05-27 ENCOUNTER — Other Ambulatory Visit: Payer: Self-pay | Admitting: Family Medicine

## 2021-05-27 DIAGNOSIS — Z1231 Encounter for screening mammogram for malignant neoplasm of breast: Secondary | ICD-10-CM

## 2021-07-07 ENCOUNTER — Other Ambulatory Visit: Payer: Self-pay

## 2021-07-07 ENCOUNTER — Ambulatory Visit
Admission: RE | Admit: 2021-07-07 | Discharge: 2021-07-07 | Disposition: A | Discharge status: Home / Self Care | Payer: BC Managed Care – PPO | Source: Ambulatory Visit | Attending: Family Medicine | Admitting: Family Medicine

## 2021-07-07 DIAGNOSIS — Z1231 Encounter for screening mammogram for malignant neoplasm of breast: Secondary | ICD-10-CM | POA: Diagnosis present

## 2021-07-08 ENCOUNTER — Other Ambulatory Visit: Payer: Self-pay | Admitting: Family Medicine

## 2021-07-08 DIAGNOSIS — R928 Other abnormal and inconclusive findings on diagnostic imaging of breast: Secondary | ICD-10-CM

## 2021-07-08 DIAGNOSIS — N6489 Other specified disorders of breast: Secondary | ICD-10-CM

## 2021-07-14 ENCOUNTER — Ambulatory Visit
Admission: RE | Admit: 2021-07-14 | Discharge: 2021-07-14 | Disposition: A | Discharge status: Home / Self Care | Payer: BC Managed Care – PPO | Source: Ambulatory Visit | Attending: Family Medicine | Admitting: Family Medicine

## 2021-07-14 ENCOUNTER — Other Ambulatory Visit: Payer: Self-pay

## 2021-07-14 DIAGNOSIS — R928 Other abnormal and inconclusive findings on diagnostic imaging of breast: Secondary | ICD-10-CM | POA: Diagnosis present

## 2021-07-14 DIAGNOSIS — N6489 Other specified disorders of breast: Secondary | ICD-10-CM | POA: Diagnosis present

## 2021-10-27 ENCOUNTER — Ambulatory Visit (INDEPENDENT_AMBULATORY_CARE_PROVIDER_SITE_OTHER): Payer: BC Managed Care – PPO

## 2021-10-27 DIAGNOSIS — D510 Vitamin B12 deficiency anemia due to intrinsic factor deficiency: Secondary | ICD-10-CM

## 2021-10-27 DIAGNOSIS — Z8742 Personal history of other diseases of the female genital tract: Secondary | ICD-10-CM | POA: Diagnosis not present

## 2021-10-27 MED ORDER — CYANOCOBALAMIN 1000 MCG/ML IJ SOLN
1000.0000 ug | Freq: Once | INTRAMUSCULAR | Status: AC
  Administered 2021-10-27: 1000 ug via INTRAMUSCULAR

## 2021-10-27 NOTE — Progress Notes (Signed)
Per orders of Dr. Javier Gutierrez, injection of B-12 given by Caterra Ostroff in left deltoid. Patient tolerated injection well.    

## 2021-11-30 ENCOUNTER — Ambulatory Visit (INDEPENDENT_AMBULATORY_CARE_PROVIDER_SITE_OTHER): Payer: BC Managed Care – PPO

## 2021-11-30 DIAGNOSIS — E538 Deficiency of other specified B group vitamins: Secondary | ICD-10-CM

## 2021-11-30 MED ORDER — CYANOCOBALAMIN 1000 MCG/ML IJ SOLN
1000.0000 ug | Freq: Once | INTRAMUSCULAR | Status: AC
  Administered 2021-11-30: 1000 ug via INTRAMUSCULAR

## 2021-11-30 NOTE — Progress Notes (Signed)
Per orders of Dr. Duncan, monthly injection of B12 given by Josselyne Onofrio G Damisha Wolff. Patient tolerated injection well.  

## 2021-12-19 ENCOUNTER — Other Ambulatory Visit: Payer: Self-pay | Admitting: Family Medicine

## 2021-12-19 DIAGNOSIS — Z8742 Personal history of other diseases of the female genital tract: Secondary | ICD-10-CM

## 2021-12-20 NOTE — Telephone Encounter (Signed)
Refill request for Vitamin B12 1000 mcg/ml  LOV - 8/1//22 Next OV - 01/24/22 Last refill - 01/21/21 #3 ml/3

## 2021-12-21 NOTE — Telephone Encounter (Signed)
Sent. Thanks.   

## 2022-01-04 ENCOUNTER — Ambulatory Visit (INDEPENDENT_AMBULATORY_CARE_PROVIDER_SITE_OTHER): Payer: BC Managed Care – PPO | Admitting: *Deleted

## 2022-01-04 DIAGNOSIS — E538 Deficiency of other specified B group vitamins: Secondary | ICD-10-CM | POA: Diagnosis not present

## 2022-01-04 MED ORDER — CYANOCOBALAMIN 1000 MCG/ML IJ SOLN
1000.0000 ug | Freq: Once | INTRAMUSCULAR | Status: AC
  Administered 2022-01-04: 1000 ug via INTRAMUSCULAR

## 2022-01-04 NOTE — Progress Notes (Signed)
Per orders of Dr. Duncan, injection of Vitamin B 12 given by Malgorzata Albert. Patient tolerated injection well.  

## 2022-01-09 ENCOUNTER — Other Ambulatory Visit: Payer: Self-pay | Admitting: Family Medicine

## 2022-01-09 DIAGNOSIS — D510 Vitamin B12 deficiency anemia due to intrinsic factor deficiency: Secondary | ICD-10-CM

## 2022-01-09 DIAGNOSIS — E538 Deficiency of other specified B group vitamins: Secondary | ICD-10-CM

## 2022-01-17 ENCOUNTER — Other Ambulatory Visit: Payer: BC Managed Care – PPO

## 2022-01-24 ENCOUNTER — Encounter: Payer: BC Managed Care – PPO | Admitting: Family Medicine

## 2022-02-24 ENCOUNTER — Other Ambulatory Visit (INDEPENDENT_AMBULATORY_CARE_PROVIDER_SITE_OTHER): Payer: BC Managed Care – PPO

## 2022-02-24 DIAGNOSIS — E538 Deficiency of other specified B group vitamins: Secondary | ICD-10-CM | POA: Diagnosis not present

## 2022-02-24 DIAGNOSIS — D510 Vitamin B12 deficiency anemia due to intrinsic factor deficiency: Secondary | ICD-10-CM | POA: Diagnosis not present

## 2022-02-24 LAB — CBC WITH DIFFERENTIAL/PLATELET
Basophils Absolute: 0.1 10*3/uL (ref 0.0–0.1)
Basophils Relative: 1.3 % (ref 0.0–3.0)
Eosinophils Absolute: 0.2 10*3/uL (ref 0.0–0.7)
Eosinophils Relative: 3.1 % (ref 0.0–5.0)
HCT: 41.4 % (ref 36.0–46.0)
Hemoglobin: 13.9 g/dL (ref 12.0–15.0)
Lymphocytes Relative: 35.1 % (ref 12.0–46.0)
Lymphs Abs: 1.8 10*3/uL (ref 0.7–4.0)
MCHC: 33.6 g/dL (ref 30.0–36.0)
MCV: 86.3 fl (ref 78.0–100.0)
Monocytes Absolute: 0.3 10*3/uL (ref 0.1–1.0)
Monocytes Relative: 5.4 % (ref 3.0–12.0)
Neutro Abs: 2.8 10*3/uL (ref 1.4–7.7)
Neutrophils Relative %: 55.1 % (ref 43.0–77.0)
Platelets: 253 10*3/uL (ref 150.0–400.0)
RBC: 4.8 Mil/uL (ref 3.87–5.11)
RDW: 13.8 % (ref 11.5–15.5)
WBC: 5.1 10*3/uL (ref 4.0–10.5)

## 2022-02-24 LAB — COMPREHENSIVE METABOLIC PANEL
ALT: 11 U/L (ref 0–35)
AST: 17 U/L (ref 0–37)
Albumin: 4.2 g/dL (ref 3.5–5.2)
Alkaline Phosphatase: 59 U/L (ref 39–117)
BUN: 16 mg/dL (ref 6–23)
CO2: 27 mEq/L (ref 19–32)
Calcium: 9.4 mg/dL (ref 8.4–10.5)
Chloride: 102 mEq/L (ref 96–112)
Creatinine, Ser: 0.92 mg/dL (ref 0.40–1.20)
GFR: 69.65 mL/min (ref >60.00)
Glucose, Bld: 82 mg/dL (ref 70–99)
Potassium: 4 mEq/L (ref 3.5–5.1)
Sodium: 141 mEq/L (ref 135–145)
Total Bilirubin: 0.6 mg/dL (ref 0.2–1.2)
Total Protein: 7 g/dL (ref 6.0–8.3)

## 2022-02-24 LAB — IRON: Iron: 127 ug/dL (ref 42–145)

## 2022-02-24 LAB — VITAMIN B12: Vitamin B-12: 627 pg/mL (ref 211–911)

## 2022-02-28 ENCOUNTER — Encounter: Payer: BC Managed Care – PPO | Admitting: Family Medicine

## 2022-03-17 ENCOUNTER — Ambulatory Visit (INDEPENDENT_AMBULATORY_CARE_PROVIDER_SITE_OTHER): Payer: BC Managed Care – PPO | Admitting: Family Medicine

## 2022-03-17 ENCOUNTER — Encounter: Payer: Self-pay | Admitting: Family Medicine

## 2022-03-17 VITALS — BP 118/82 | HR 88 | Temp 97.5°F | Ht 70.0 in | Wt 230.0 lb

## 2022-03-17 DIAGNOSIS — D510 Vitamin B12 deficiency anemia due to intrinsic factor deficiency: Secondary | ICD-10-CM

## 2022-03-17 DIAGNOSIS — E538 Deficiency of other specified B group vitamins: Secondary | ICD-10-CM

## 2022-03-17 DIAGNOSIS — Z0000 Encounter for general adult medical examination without abnormal findings: Secondary | ICD-10-CM | POA: Diagnosis not present

## 2022-03-17 DIAGNOSIS — Z23 Encounter for immunization: Secondary | ICD-10-CM | POA: Diagnosis not present

## 2022-03-17 DIAGNOSIS — Z7189 Other specified counseling: Secondary | ICD-10-CM

## 2022-03-17 MED ORDER — CYANOCOBALAMIN 1000 MCG/ML IJ SOLN
1000.0000 ug | Freq: Once | INTRAMUSCULAR | Status: AC
  Administered 2022-03-17: 1000 ug via INTRAMUSCULAR

## 2022-03-17 NOTE — Patient Instructions (Addendum)
Ask about getting a tetanus shot with a B12 shot at a nurse visit.  Take care.  Glad to see you. Update me as needed.

## 2022-03-17 NOTE — Assessment & Plan Note (Signed)
Living will d/w pt.  Would have her daughter designated if patient were incapacitated.

## 2022-03-17 NOTE — Progress Notes (Signed)
CPE- See plan.  Routine anticipatory guidance given to patient.  See health maintenance.  The possibility exists that previously documented standard health maintenance information may have been brought forward from a previous encounter into this note.  If needed, that same information has been updated to reflect the current situation based on today's encounter.    Tetanus 2013 Flu done yearly PNA d/w pt.   Shingles prev done.   covid vaccine 2021 Pap not due, d/w pt.   Mammogram 2023 DXA not due, d/w pt.  Colonoscopy 08/2016 with 5 year f/u.  she has f/u pending.  Living will d/w pt.  Would have her daughter designated if patient were incapacitated.   Diet and exercise d/w pt.   HIV screening prev done.   B12 def d/w pt.  She has been getting IM injections here, she can consider teaching to self administer.  B12 wnl.  Labs d/w pt.  No paresthesias.    Retired, d/w pt.  This was a good change for patient.  She is working part time at a school now, doing multiple roles.    PMH and SH reviewed  Meds, vitals, and allergies reviewed.   ROS: Per HPI.  Unless specifically indicated otherwise in HPI, the patient denies:  General: fever. Eyes: acute vision changes ENT: sore throat Cardiovascular: chest pain Respiratory: SOB GI: vomiting GU: dysuria Musculoskeletal: acute back pain Derm: acute rash Neuro: acute motor dysfunction Psych: worsening mood Endocrine: polydipsia Heme: bleeding Allergy: hayfever  GEN: nad, alert and oriented HEENT: ncat NECK: supple w/o LA CV: rrr. PULM: ctab, no inc wob ABD: soft, +bs EXT: no edema SKIN: no acute rash

## 2022-03-20 NOTE — Assessment & Plan Note (Signed)
B12 def d/w pt.  She has been getting IM injections here, she can consider teaching to self administer.  B12 wnl.  Labs d/w pt.  No paresthesias.

## 2022-03-20 NOTE — Assessment & Plan Note (Signed)
Tetanus 2013 Flu done yearly PNA d/w pt.   Shingles prev done.   covid vaccine 2021 Pap not due, d/w pt.   Mammogram 2023 DXA not due, d/w pt.  Colonoscopy 08/2016 with 5 year f/u.  she has f/u pending.  Living will d/w pt.  Would have her daughter designated if patient were incapacitated.   Diet and exercise d/w pt.   HIV screening prev done.

## 2022-04-19 ENCOUNTER — Ambulatory Visit (INDEPENDENT_AMBULATORY_CARE_PROVIDER_SITE_OTHER): Payer: BC Managed Care – PPO

## 2022-04-19 DIAGNOSIS — Z23 Encounter for immunization: Secondary | ICD-10-CM | POA: Diagnosis not present

## 2022-04-19 DIAGNOSIS — E538 Deficiency of other specified B group vitamins: Secondary | ICD-10-CM

## 2022-04-19 MED ORDER — CYANOCOBALAMIN 1000 MCG/ML IJ SOLN
1000.0000 ug | Freq: Once | INTRAMUSCULAR | Status: AC
  Administered 2022-04-19: 1000 ug via INTRAMUSCULAR

## 2022-04-19 NOTE — Progress Notes (Signed)
Per orders of Dr. Damita Dunnings, injection of monthly B12 1000 mcg/ml given by Pilar Grammes, CMA in Left Deltoid. Patient tolerated injection well.

## 2022-05-24 ENCOUNTER — Ambulatory Visit (INDEPENDENT_AMBULATORY_CARE_PROVIDER_SITE_OTHER): Payer: BC Managed Care – PPO

## 2022-05-24 DIAGNOSIS — E538 Deficiency of other specified B group vitamins: Secondary | ICD-10-CM | POA: Diagnosis not present

## 2022-05-24 MED ORDER — CYANOCOBALAMIN 1000 MCG/ML IJ SOLN
1000.0000 ug | Freq: Once | INTRAMUSCULAR | Status: AC
  Administered 2022-05-24: 1000 ug via INTRAMUSCULAR

## 2022-05-24 NOTE — Progress Notes (Signed)
Per orders of Dr. Elsie Stain, injection of Vitamin B 12 given by Ozzie Hoyle. Patient tolerated injection well.

## 2022-06-29 ENCOUNTER — Ambulatory Visit (INDEPENDENT_AMBULATORY_CARE_PROVIDER_SITE_OTHER): Payer: BC Managed Care – PPO

## 2022-06-29 ENCOUNTER — Other Ambulatory Visit: Payer: Self-pay | Admitting: Family Medicine

## 2022-06-29 DIAGNOSIS — Z1231 Encounter for screening mammogram for malignant neoplasm of breast: Secondary | ICD-10-CM

## 2022-06-29 DIAGNOSIS — E538 Deficiency of other specified B group vitamins: Secondary | ICD-10-CM

## 2022-06-29 MED ORDER — CYANOCOBALAMIN 1000 MCG/ML IJ SOLN
1000.0000 ug | Freq: Once | INTRAMUSCULAR | Status: AC
  Administered 2022-06-29: 1000 ug via INTRAMUSCULAR

## 2022-06-29 NOTE — Progress Notes (Signed)
Patient presented for B 12 injection given by Andersen Iorio, CMA to left deltoid, patient voiced no concerns nor showed any signs of distress during injection.  

## 2022-07-19 ENCOUNTER — Ambulatory Visit
Admission: RE | Admit: 2022-07-19 | Discharge: 2022-07-19 | Disposition: A | Discharge status: Home / Self Care | Payer: BC Managed Care – PPO | Source: Ambulatory Visit | Attending: Family Medicine | Admitting: Family Medicine

## 2022-07-19 DIAGNOSIS — Z1231 Encounter for screening mammogram for malignant neoplasm of breast: Secondary | ICD-10-CM | POA: Insufficient documentation

## 2022-08-02 ENCOUNTER — Ambulatory Visit (INDEPENDENT_AMBULATORY_CARE_PROVIDER_SITE_OTHER): Payer: BC Managed Care – PPO

## 2022-08-02 DIAGNOSIS — E538 Deficiency of other specified B group vitamins: Secondary | ICD-10-CM | POA: Diagnosis not present

## 2022-08-02 MED ORDER — CYANOCOBALAMIN 1000 MCG/ML IJ SOLN
1000.0000 ug | Freq: Once | INTRAMUSCULAR | Status: AC
  Administered 2022-08-02: 1000 ug via INTRAMUSCULAR

## 2022-08-02 NOTE — Progress Notes (Signed)
Per orders of Dr. Loura Pardon, injection of B-12 given by Barkley Bruns in left deltoid. Patient tolerated injection well.

## 2022-09-06 ENCOUNTER — Ambulatory Visit (INDEPENDENT_AMBULATORY_CARE_PROVIDER_SITE_OTHER): Payer: BC Managed Care – PPO

## 2022-09-06 DIAGNOSIS — E538 Deficiency of other specified B group vitamins: Secondary | ICD-10-CM | POA: Diagnosis not present

## 2022-09-06 MED ORDER — CYANOCOBALAMIN 1000 MCG/ML IJ SOLN
1000.0000 ug | Freq: Once | INTRAMUSCULAR | Status: AC
  Administered 2022-09-06: 1000 ug via INTRAMUSCULAR

## 2022-09-06 NOTE — Progress Notes (Signed)
Per orders of Dr. Graham Duncan, injection of B-12 given by Ivelis Norgard in right deltoid. Patient tolerated injection well.    

## 2022-10-11 ENCOUNTER — Ambulatory Visit (INDEPENDENT_AMBULATORY_CARE_PROVIDER_SITE_OTHER): Payer: BC Managed Care – PPO

## 2022-10-11 DIAGNOSIS — E538 Deficiency of other specified B group vitamins: Secondary | ICD-10-CM

## 2022-10-11 MED ORDER — CYANOCOBALAMIN 1000 MCG/ML IJ SOLN
1000.0000 ug | Freq: Once | INTRAMUSCULAR | Status: AC
  Administered 2022-10-11: 1000 ug via INTRAMUSCULAR

## 2022-10-11 NOTE — Progress Notes (Signed)
Per orders of Dr. Graham Duncan, injection of B-12 given by Illya Gienger in left deltoid. Patient tolerated injection well.    

## 2022-11-16 ENCOUNTER — Ambulatory Visit (INDEPENDENT_AMBULATORY_CARE_PROVIDER_SITE_OTHER): Payer: BC Managed Care – PPO

## 2022-11-16 DIAGNOSIS — E538 Deficiency of other specified B group vitamins: Secondary | ICD-10-CM | POA: Diagnosis not present

## 2022-11-16 MED ORDER — CYANOCOBALAMIN 1000 MCG/ML IJ SOLN
1000.0000 ug | Freq: Once | INTRAMUSCULAR | Status: AC
Start: 2022-11-16 — End: 2022-11-16
  Administered 2022-11-16: 1000 ug via INTRAMUSCULAR

## 2022-11-16 NOTE — Progress Notes (Signed)
Per orders of Dr. Eustaquio Boyden, injection of Vitamin B 12 given by Lewanda Rife in right deltoid. Patient tolerated injection well. Patient will make appointment for 1 month.

## 2022-11-22 ENCOUNTER — Telehealth: Payer: BC Managed Care – PPO | Admitting: Family Medicine

## 2022-12-21 ENCOUNTER — Ambulatory Visit (INDEPENDENT_AMBULATORY_CARE_PROVIDER_SITE_OTHER): Payer: BC Managed Care – PPO

## 2022-12-21 DIAGNOSIS — E538 Deficiency of other specified B group vitamins: Secondary | ICD-10-CM | POA: Diagnosis not present

## 2022-12-21 MED ORDER — CYANOCOBALAMIN 1000 MCG/ML IJ SOLN
1000.0000 ug | Freq: Once | INTRAMUSCULAR | Status: AC
Start: 2022-12-21 — End: 2022-12-21
  Administered 2022-12-21: 1000 ug via INTRAMUSCULAR

## 2022-12-21 NOTE — Progress Notes (Signed)
Per orders of Dr. Eustaquio Boyden, injection of Vitamin B-12 given by Melina Copa in left deltoid. Patient tolerated injection well. Patient will make appointment for 1 month.

## 2023-01-11 ENCOUNTER — Encounter (INDEPENDENT_AMBULATORY_CARE_PROVIDER_SITE_OTHER): Payer: Self-pay

## 2023-01-24 ENCOUNTER — Ambulatory Visit (INDEPENDENT_AMBULATORY_CARE_PROVIDER_SITE_OTHER): Payer: BC Managed Care – PPO

## 2023-01-24 DIAGNOSIS — E538 Deficiency of other specified B group vitamins: Secondary | ICD-10-CM | POA: Diagnosis not present

## 2023-01-24 MED ORDER — CYANOCOBALAMIN 1000 MCG/ML IJ SOLN
1000.0000 ug | Freq: Once | INTRAMUSCULAR | Status: AC
Start: 2023-01-24 — End: 2023-01-24
  Administered 2023-01-24: 1000 ug via INTRAMUSCULAR

## 2023-01-24 NOTE — Progress Notes (Signed)
Per orders of Dr. Para March, injection of monthly B12 1000 mcg/ml given by Eual Fines, CMA in Right Deltoid. Patient tolerated injection well.  Note made on next month's visit note she will get her flu shot next month with her B12.

## 2023-01-26 ENCOUNTER — Encounter (INDEPENDENT_AMBULATORY_CARE_PROVIDER_SITE_OTHER): Payer: Self-pay

## 2023-02-23 ENCOUNTER — Ambulatory Visit (INDEPENDENT_AMBULATORY_CARE_PROVIDER_SITE_OTHER): Payer: BC Managed Care – PPO

## 2023-02-23 DIAGNOSIS — E538 Deficiency of other specified B group vitamins: Secondary | ICD-10-CM

## 2023-02-23 DIAGNOSIS — Z23 Encounter for immunization: Secondary | ICD-10-CM

## 2023-02-23 MED ORDER — CYANOCOBALAMIN 1000 MCG/ML IJ SOLN
1000.0000 ug | Freq: Once | INTRAMUSCULAR | Status: AC
Start: 2023-02-23 — End: 2023-02-23
  Administered 2023-02-23: 1000 ug via INTRAMUSCULAR

## 2023-02-23 NOTE — Progress Notes (Signed)
 Per orders of Dr. Crawford Givens, injection of B-12 given by Nanci Pina in left deltoid. Patient tolerated injection well.

## 2023-03-28 ENCOUNTER — Ambulatory Visit: Payer: BC Managed Care – PPO

## 2023-03-28 DIAGNOSIS — E538 Deficiency of other specified B group vitamins: Secondary | ICD-10-CM

## 2023-03-28 MED ORDER — CYANOCOBALAMIN 1000 MCG/ML IJ SOLN
1000.0000 ug | Freq: Once | INTRAMUSCULAR | Status: AC
Start: 2023-03-28 — End: 2023-03-28
  Administered 2023-03-28: 1000 ug via INTRAMUSCULAR

## 2023-03-28 NOTE — Progress Notes (Signed)
Per orders of Dr. Crawford Givens, injection of B-12 given by Melina Copa in right deltoid. Patient tolerated injection well. Patient will make appointment for 1 month.

## 2023-04-11 NOTE — Progress Notes (Unsigned)
    [name] T. Emma Schupp, MD, CAQ Sports Medicine [facility] [address] [place], [address]  Phone: [phone]  FAX: [phone]  Jessica Bennett - 57 y.o. female  MRN [id number]  Date of Birth: 05-04-1966  Date: 04/12/2023  PCP: Joaquim Nam, MD  Referral: Joaquim Nam, MD  No chief complaint on file.  Subjective:   Jessica Bennett is a 57 y.o. very pleasant female patient with There is no height or weight on file to calculate BMI. who presents with the following:  She is a very pleasant patient, who I have never seen in the past.  I do recall her personally from multiple decades ago.  She presents with ongoing right-sided knee pain.    Review of Systems is noted in the HPI, as appropriate  Objective:   There were no vitals taken for this visit.  GEN: No acute distress; alert,appropriate. PULM: Breathing comfortably in no respiratory distress PSYCH: Normally interactive.   Laboratory and Imaging Data:  Assessment and Plan:   ***

## 2023-04-12 ENCOUNTER — Ambulatory Visit: Payer: BC Managed Care – PPO | Admitting: Family Medicine

## 2023-04-12 ENCOUNTER — Ambulatory Visit (INDEPENDENT_AMBULATORY_CARE_PROVIDER_SITE_OTHER)
Admission: RE | Admit: 2023-04-12 | Discharge: 2023-04-12 | Disposition: A | Discharge status: Home / Self Care | Payer: BC Managed Care – PPO | Source: Ambulatory Visit | Attending: Family Medicine | Admitting: Family Medicine

## 2023-04-12 ENCOUNTER — Encounter: Payer: Self-pay | Admitting: Family Medicine

## 2023-04-12 VITALS — BP 130/80 | HR 94 | Temp 97.8°F | Ht 69.5 in | Wt 247.4 lb

## 2023-04-12 DIAGNOSIS — M25561 Pain in right knee: Secondary | ICD-10-CM | POA: Diagnosis not present

## 2023-04-12 DIAGNOSIS — L03115 Cellulitis of right lower limb: Secondary | ICD-10-CM

## 2023-04-12 DIAGNOSIS — M1711 Unilateral primary osteoarthritis, right knee: Secondary | ICD-10-CM | POA: Diagnosis not present

## 2023-04-12 MED ORDER — CEPHALEXIN 500 MG PO CAPS: 500.0000 mg | ORAL_CAPSULE | Freq: Three times a day (TID) | ORAL | 0 refills | Status: DC

## 2023-04-12 MED ORDER — CELECOXIB 200 MG PO CAPS
200.0000 mg | ORAL_CAPSULE | Freq: Every day | ORAL | 2 refills | Status: DC
Start: 2023-04-12 — End: 2023-06-22

## 2023-04-20 ENCOUNTER — Ambulatory Visit: Payer: BC Managed Care – PPO | Attending: Family Medicine | Admitting: Physical Therapy

## 2023-04-20 VITALS — BP 104/61 | HR 85

## 2023-04-20 DIAGNOSIS — M25561 Pain in right knee: Secondary | ICD-10-CM | POA: Insufficient documentation

## 2023-04-20 NOTE — Addendum Note (Signed)
Addended by: Johnn Hai on: 04/20/2023 03:10 PM   Modules accepted: Orders

## 2023-04-20 NOTE — Therapy (Signed)
OUTPATIENT PHYSICAL THERAPY LOWER EXTREMITY EVALUATION   Patient Name: Jessica Bennett MRN: [id number] DOB:June 23, 1965, 57 y.o., female Today's Date: 04/20/2023  END OF SESSION:  PT End of Session - 04/20/23 1408     Visit Number 1    Number of Visits 20    Date for PT Re-Evaluation 06/29/23    Authorization Type BCBS State 2024    Authorization Time Period 10    Authorization - Visit Number 1    Authorization - Number of Visits 20    Progress Note Due on Visit 10    PT Start Time 1030    PT Stop Time 1115    PT Time Calculation (min) 45 min    Activity Tolerance Patient tolerated treatment well    Behavior During Therapy WFL for tasks assessed/performed             Past Medical History:  Diagnosis Date   Anemia    pernicious anemia (b12)   B12 deficiency    Family history of ovarian cancer    Past Surgical History:  Procedure Laterality Date   BREAST BIOPSY Right 2010   benign   broken ankle  2011   broken tibia right, Dr. Gerrit Heck   HYSTEROSCOPY WITH D & C N/A 05/21/2020   Procedure: DILATATION AND CURETTAGE /HYSTEROSCOPY WITH MINERVA;  Surgeon: Conard Novak, MD;  Location: [facility];  Service: Gynecology;  Laterality: N/A;   VAGINAL DELIVERY     WISDOM TOOTH EXTRACTION     Patient Active Problem List   Diagnosis Date Noted   Endometrial polyp 05/21/2020   Meralgia paresthetica 01/27/2020   Shoulder pain 01/27/2020   Iron deficiency anemia 01/27/2020   Advance care planning 12/26/2018   Hair loss 11/28/2017   Routine general medical examination at a health care facility 10/22/2012   Pernicious anemia 12/19/2011    PCP: Joaquim Nam, MD    REFERRING PROVIDER: Hannah Beat, MD   REFERRING DIAG: [phone] (ICD-10-CM) - Acute pain of right knee   THERAPY DIAG:  Acute pain of right knee  Rationale for Evaluation and Treatment: Rehabilitation  ONSET DATE: 4 weeks ago  SUBJECTIVE:   SUBJECTIVE STATEMENT: No pain right now. Still  bothersome on stairs and transfers. Pain can go up to a 6/10 on NPRS but subsides within a minute.   PERTINENT HISTORY: Has had pain on and off for several years. Has ORIF in right LE after injury 13 years ago. Pain was consistent at start but worse with prolonged extension and squatting and transfers. Has been better since celebrex. Has dx of arthritis on R knee. No Hx of rheumatoid arthritis. No specific event.  PAIN:  Are you having pain? 0/10 on NPRS currently, but can go up to a 6/10 when going up stairs, or walking but resolves within 1 minute. Relieving factors are ice. Pain located around center of knee and lateral joint line.   PRECAUTIONS: None  RED FLAGS: None   WEIGHT BEARING RESTRICTIONS: No  FALLS:  Has patient fallen in last 6 months? No  LIVING ENVIRONMENT: Lives with: lives with their family and lives alone Lives in: House/apartment Stairs: Yes: External: 4 steps; can reach both Has following equipment at home: None  OCCUPATION: Retired  PLOF: Independent  PATIENT GOALS: Transfer and go up stairs without pain. Get back to walking and yoga.   NEXT MD VISIT: 12/11  OBJECTIVE:  Note: Objective measures were completed at Evaluation unless otherwise noted.  DIAGNOSTIC FINDINGS: Not listed  PATIENT SURVEYS:  FOTO - deferred to next visit  COGNITION: Overall cognitive status: Within functional limits for tasks assessed     SENSATION: Not tested  PALPATION: Lateral joint line tenderness of R knee  LOWER EXTREMITY ROM:  Active/Passive ROM Right eval Left eval  Hip flexion    Hip extension    Hip abduction    Hip adduction    Hip internal rotation    Hip external rotation    Knee flexion 120/130 135/135  Knee extension 8/8 2/2  Ankle dorsiflexion    Ankle plantarflexion    Ankle inversion    Ankle eversion     (Blank rows = not tested)  LOWER EXTREMITY MMT:  MMT Right eval Left eval  Hip flexion 4 4  Hip extension    Hip abduction     Hip adduction    Hip internal rotation 4- 4-  Hip external rotation 4- 4-  Knee flexion 4 4  Knee extension 4+ 4+  Ankle dorsiflexion    Ankle plantarflexion    Ankle inversion    Ankle eversion     (Blank rows = not tested)  LOWER EXTREMITY SPECIAL TESTS:  Knee special tests: McMurray's test: positive , Thessaly test: positive , and Step up/down test: negative  GAIT: Not tested   TODAY'S TREATMENT:                                                                                                                              DATE:   04/20/23 Forward step downs 2x10 Standing hamstring curls with green TB 2x10     PATIENT EDUCATION:  Education details: Pt educated on PT findings,  Person educated: Patient Education method: Explanation Education comprehension: verbalized understanding  HOME EXERCISE PROGRAM: Access Code: [email or web address] URL: [email or web address] Date: 04/20/2023 Prepared by: Ellin Goodie Exercises - Forward Step Down with Heel Tap and Counter Support  - 3-4 x weekly - 3 sets - 10 reps - Standing Knee Flexion AROM with Chair Support  - 3-4 x weekly - 3 sets - 5 reps  ASSESSMENT:  CLINICAL IMPRESSION: Patient is a 57 y.o. white female who was seen today for physical therapy evaluation and treatment for acute right knee pain. Pt has PMH of right knee OA. Her deficits include increased right knee pain with decreased range of motion; bilateral hip and knee weakness. Her knee pain with lateral joint line tenderness, forced knee extension, positive McMurray's and Thessaly's test indicate lateral meniscus involvement as a pain generator. PFPS ruled out with negative step down and patellar tilt test and none focal pain on knee cap. This pain limits her ability to perform transfers, walk, and ascend stairs and restricted her ability to go on walks and participate in her yoga classes. Pt would benefit from PT to address aforementioned deficits and return to  walks and exercise classes.     OBJECTIVE IMPAIRMENTS: decreased ROM, decreased strength, and pain.   ACTIVITY LIMITATIONS: standing,  sleeping, stairs, and transfers  PARTICIPATION LIMITATIONS: community activity and her yoga classes  PERSONAL FACTORS: Time since onset of injury/illness/exacerbation and 1 comorbidity: OA are also affecting patient's functional outcome.   REHAB POTENTIAL: Fair preexisting OA in right knee may make treatment of meniscal pain more difficult, but the pain relatively new, which is a positive prognostic indicator.  CLINICAL DECISION MAKING: Stable/uncomplicated  EVALUATION COMPLEXITY: Low   GOALS: Goals reviewed with patient? No  SHORT TERM GOALS: Target date: 05/04/2023    Patient will demonstrate independent understanding of HEP to be able effectively manage underlying MSK condition.   Baseline: Goal status: INITIAL   LONG TERM GOALS: Target date: 06/29/2023    Patient will have improved function and activity level as evidenced by an increase in FOTO score by 10 points or more.   Baseline: NOT TESTED Goal status: INITIAL  2.  Pt will increase right knee ROM to be symmetrical with the left Baseline: (R/L) Knee flexion: 120 deg Active(130 deg Passive) / 135 (A/PROM). Ext: 2 deg /8 deg (AROM=PROM) Goal status: INITIAL  3.  Pt will increase bilateral hip and knee strength measures by 1/3 (i.e. 4 to 4+) to increase LE stability in order to better perform ADL's and activities of her exercise classes. Baseline: (R/L) Hip - flex: 4/4, ER: 4-/4-, IR: 4-/4-; Knee - flex: 4/4, ext 4+/4+ Goal status: INITIAL    PLAN:  PT FREQUENCY: 1-2x/week  PT DURATION: 10 weeks  PLANNED INTERVENTIONS: 97164- PT Re-evaluation, 97110-Therapeutic exercises, 97530- Therapeutic activity, 97112- Neuromuscular re-education, 97535- Self Care, 16109- Manual therapy, Stair training, Dry Needling, and Joint mobilization  PLAN FOR NEXT SESSION: Assess FOTO, progress knee  strength exercises, add hip strengthening exercises   Arcelia Jew, Student-PT 04/20/2023, 10:18 AM

## 2023-04-24 ENCOUNTER — Ambulatory Visit: Payer: BC Managed Care – PPO | Admitting: Physical Therapy

## 2023-04-24 DIAGNOSIS — M25561 Pain in right knee: Secondary | ICD-10-CM

## 2023-04-24 NOTE — Therapy (Unsigned)
OUTPATIENT PHYSICAL THERAPY LOWER EXTREMITY TREATMENT   Patient Name: Jessica Bennett MRN: [id number] DOB:Jun 03, 1966, 57 y.o., female Today's Date: 04/24/2023  END OF SESSION:  PT End of Session - 04/24/23 1520     Visit Number 2    Number of Visits 20    Date for PT Re-Evaluation 06/29/23    Authorization Type [employer]BS State 2024    Authorization Time Period 10    Authorization - Visit Number 2    Authorization - Number of Visits 20    Progress Note Due on Visit 10    PT Start Time 1430    PT Stop Time 1515    PT Time Calculation (min) 45 min    Activity Tolerance Patient tolerated treatment well    Behavior During Therapy WFL for tasks assessed/performed              Past Medical History:  Diagnosis Date   Anemia    pernicious anemia (b12)   B12 deficiency    Family history of ovarian cancer    Past Surgical History:  Procedure Laterality Date   BREAST BIOPSY Right 2010   benign   broken ankle  2011   broken tibia right, Dr. Gerrit Heck   HYSTEROSCOPY WITH D & C N/A 05/21/2020   Procedure: DILATATION AND CURETTAGE /HYSTEROSCOPY WITH MINERVA;  Surgeon: Conard Novak, MD;  Location: [facility];  Service: Gynecology;  Laterality: N/A;   VAGINAL DELIVERY     WISDOM TOOTH EXTRACTION     Patient Active Problem List   Diagnosis Date Noted   Endometrial polyp 05/21/2020   Meralgia paresthetica 01/27/2020   Shoulder pain 01/27/2020   Iron deficiency anemia 01/27/2020   Advance care planning 12/26/2018   Hair loss 11/28/2017   Routine general medical examination at a health care facility 10/22/2012   Pernicious anemia 12/19/2011    PCP: Joaquim Nam, MD    REFERRING PROVIDER: Hannah Beat, MD   REFERRING DIAG: [phone] (ICD-10-CM) - Acute pain of right knee   THERAPY DIAG:  Acute pain of right knee  Rationale for Evaluation and Treatment: Rehabilitation  ONSET DATE: 4 weeks ago  SUBJECTIVE:   SUBJECTIVE STATEMENT: Pt reports knee pain feel  noticeably better 2/10 on NPRS currently .   PERTINENT HISTORY: Has had pain on and off for several years. Has ORIF in right LE after injury 13 years ago. Pain was consistent at start but worse with prolonged extension and squatting and transfers. Has been better since celebrex. Has dx of arthritis on R knee. No Hx of rheumatoid arthritis. No specific event.  PAIN:  Are you having pain? 0/10 on NPRS currently, but can go up to a 6/10 when going up stairs, or walking but resolves within 1 minute. Relieving factors are ice. Pain located around center of knee and lateral joint line.   PRECAUTIONS: None  RED FLAGS: None   WEIGHT BEARING RESTRICTIONS: No  FALLS:  Has patient fallen in last 6 months? No  LIVING ENVIRONMENT: Lives with: lives with their family and lives alone Lives in: House/apartment Stairs: Yes: External: 4 steps; can reach both Has following equipment at home: None  OCCUPATION: Retired  PLOF: Independent  PATIENT GOALS: Transfer and go up stairs without pain. Get back to walking and yoga.   NEXT MD VISIT: 12/11  OBJECTIVE:  Note: Objective measures were completed at Evaluation unless otherwise noted.  DIAGNOSTIC FINDINGS: Not listed  PATIENT SURVEYS:  FOTO - deferred to next visit  COGNITION:  Overall cognitive status: Within functional limits for tasks assessed     SENSATION: Not tested  PALPATION: Lateral joint line tenderness of R knee  LOWER EXTREMITY ROM:  Active/Passive ROM Right eval Left eval  Hip flexion    Hip extension    Hip abduction    Hip adduction    Hip internal rotation    Hip external rotation    Knee flexion 120/130 135/135  Knee extension 8/8 2/2  Ankle dorsiflexion    Ankle plantarflexion    Ankle inversion    Ankle eversion     (Blank rows = not tested)  LOWER EXTREMITY MMT:  MMT Right eval Left eval  Hip flexion 4 4  Hip extension    Hip abduction    Hip adduction    Hip internal rotation 4- 4-  Hip  external rotation 4- 4-  Knee flexion 4 4  Knee extension 4+ 4+  Ankle dorsiflexion    Ankle plantarflexion    Ankle inversion    Ankle eversion     (Blank rows = not tested)  LOWER EXTREMITY SPECIAL TESTS:  Knee special tests: McMurray's test: positive , Thessaly test: positive , and Step up/down test: negative  GAIT: Not tested   TODAY'S TREATMENT:                                                                                                                              DATE:   04/24/23 FOTO - deferred to next session Forward step downs on right leg 2x8 Leg press 3x10 @75lbs  Resisted hip ER/IR with red theraband 2x10 per side Squats to 60cm seat 2x10 Pt educated on proper form for sit to stand transfers without use of hands  - 3x5 sit to stands from 20 inch seat with min cuing for weight shift and use of momentum   04/20/23 Forward step downs 2x10 Standing hamstring curls with green TB 2x10     PATIENT EDUCATION:  Education details: Pt educated on PT findings,  Person educated: Patient Education method: Explanation Education comprehension: verbalized understanding  HOME EXERCISE PROGRAM: Access Code: [email or web address] URL: [email or web address] Date: 04/24/2023 Prepared by: Ellin Goodie  Exercises - Forward Step Down with Heel Tap and Counter Support  - 3-4 x weekly - 3 sets - 10 reps - Standing Knee Flexion AROM with Chair Support  - 3-4 x weekly - 3 sets - 5 reps - Mini Squat with Chair  - 3-4 x weekly - 3 sets - 10 reps - Seated Hip External Rotation with Resistance  - 3-4 x weekly - 3 sets - 10 reps - Seated Hip Internal Rotation with Resistance  - 3-4 x weekly - 3 sets - 10 reps  ASSESSMENT:  CLINICAL IMPRESSION: Patient is a 57 y.o. white female who was seen today for physical therapy treatment for acute right knee pain. PT focused on hip and knee strength to increase stability during ADLs and her exercise classes. Pt was  successful in new  strength exercises as evidenced by performing all of them with proper form and no increase in pain. Pt also required only min cuing to stand from 20" inch seat without use of her hands; she had no increase in pain.  Pt would benefit from PT to address aforementioned deficits and return to walks and exercise classes.    OBJECTIVE IMPAIRMENTS: decreased ROM, decreased strength, and pain.   ACTIVITY LIMITATIONS: standing, sleeping, stairs, and transfers  PARTICIPATION LIMITATIONS: community activity and her yoga classes  PERSONAL FACTORS: Time since onset of injury/illness/exacerbation and 1 comorbidity: OA are also affecting patient's functional outcome.   REHAB POTENTIAL: Fair preexisting OA in right knee may make treatment of meniscal pain more difficult, but the pain relatively new, which is a positive prognostic indicator.  CLINICAL DECISION MAKING: Stable/uncomplicated  EVALUATION COMPLEXITY: Low   GOALS: Goals reviewed with patient? No  SHORT TERM GOALS: Target date: 05/04/2023    Patient will demonstrate independent understanding of HEP to be able effectively manage underlying MSK condition.   Baseline: Goal status: ONGOING   LONG TERM GOALS: Target date: 06/29/2023    Patient will have improved function and activity level as evidenced by an increase in FOTO score by 10 points or more.   Baseline: NOT TESTED Goal status: ONGOING  2.  Pt will increase right knee ROM to be symmetrical with the left Baseline: (R/L) Knee flexion: 120 deg Active(130 deg Passive) / 135 (A/PROM). Ext: 2 deg /8 deg (AROM=PROM) Goal status: ONGOING  3.  Pt will increase bilateral hip and knee strength measures by 1/3 (i.e. 4 to 4+) to increase LE stability in order to better perform ADL's and activities of her exercise classes. Baseline: (R/L) Hip - flex: 4/4, ER: 4-/4-, IR: 4-/4-; Knee - flex: 4/4, ext 4+/4+ Goal status: ONGOING    PLAN:  PT FREQUENCY: 1-2x/week  PT DURATION: 10  weeks  PLANNED INTERVENTIONS: 97164- PT Re-evaluation, 97110-Therapeutic exercises, 97530- Therapeutic activity, 97112- Neuromuscular re-education, 97535- Self Care, 16109- Manual therapy, Stair training, Dry Needling, and Joint mobilization  PLAN FOR NEXT SESSION: Assess FOTO, progress knee strength exercises and hip strengthening exercises, progress to more regional LE exercises   Arcelia Jew, Student-PT 04/20/2023, 10:18 AM

## 2023-04-25 NOTE — Therapy (Deleted)
[facility] [address], [place], [address] Phone: [phone]   Fax:  [phone]  Patient Details  Name: Jessica Bennett MRN: [id number] Date of Birth: 1966-01-18 Referring Provider:  Hannah Beat, MD  Encounter Date: 04/24/2023   Johnn Hai, PT 04/25/2023, 9:23 AM  [facility] [address], [place], [address] Phone: [phone]   Fax:  [phone]

## 2023-04-26 ENCOUNTER — Ambulatory Visit: Payer: BC Managed Care – PPO | Admitting: Physical Therapy

## 2023-04-26 DIAGNOSIS — M25561 Pain in right knee: Secondary | ICD-10-CM | POA: Diagnosis not present

## 2023-04-26 NOTE — Therapy (Addendum)
OUTPATIENT PHYSICAL THERAPY LOWER EXTREMITY TREATMENT   Patient Name: Jessica Bennett MRN: [id number] DOB:[birth year], 57 y.o., female Today's Date: 04/26/2023  END OF SESSION:  PT End of Session - 04/26/23 1530     Visit Number 3    Number of Visits 20    Date for PT Re-Evaluation 06/29/23    Authorization Type BCBS State 2024    Authorization - Visit Number 3    Authorization - Number of Visits 20    Progress Note Due on Visit 10    PT Start Time 1430    PT Stop Time 1512    PT Time Calculation (min) 42 min    Activity Tolerance Patient tolerated treatment well    Behavior During Therapy WFL for tasks assessed/performed               Past Medical History:  Diagnosis Date   Anemia    pernicious anemia (b12)   B12 deficiency    Family history of ovarian cancer    Past Surgical History:  Procedure Laterality Date   BREAST BIOPSY Right 2010   benign   broken ankle  2011   broken tibia right, Dr. Gerrit Heck   HYSTEROSCOPY WITH D & C N/A 05/21/2020   Procedure: DILATATION AND CURETTAGE /HYSTEROSCOPY WITH MINERVA;  Surgeon: Conard Novak, MD;  Location: [facility] ORS;  Service: Gynecology;  Laterality: N/A;   VAGINAL DELIVERY     WISDOM TOOTH EXTRACTION     Patient Active Problem List   Diagnosis Date Noted   Endometrial polyp 05/21/2020   Meralgia paresthetica 01/27/2020   Shoulder pain 01/27/2020   Iron deficiency anemia 01/27/2020   Advance care planning 12/26/2018   Hair loss 11/28/2017   Routine general medical examination at a health care facility 10/22/2012   Pernicious anemia 12/19/2011    PCP: Joaquim Nam, MD    REFERRING PROVIDER: Hannah Beat, MD   REFERRING DIAG: [phone] (ICD-10-CM) - Acute pain of right knee   THERAPY DIAG:  Acute pain of right knee  Rationale for Evaluation and Treatment: Rehabilitation  ONSET DATE: 4 weeks ago  SUBJECTIVE:   SUBJECTIVE STATEMENT: Pt reports feeling "great"  - 0/10 pain on NPRS today.    PERTINENT HISTORY: Has had pain on and off for several years. Has ORIF in right LE after injury 13 years ago. Pain was consistent at start but worse with prolonged extension and squatting and transfers. Has been better since celebrex. Has dx of arthritis on R knee. No Hx of rheumatoid arthritis. No specific event.  PAIN:  Are you having pain? 0/10 on NPRS currently, but can go up to a 6/10 when going up stairs, or walking but resolves within 1 minute. Relieving factors are ice. Pain located around center of knee and lateral joint line.   PRECAUTIONS: None  RED FLAGS: None   WEIGHT BEARING RESTRICTIONS: No  FALLS:  Has patient fallen in last 6 months? No  LIVING ENVIRONMENT: Lives with: lives with their family and lives alone Lives in: House/apartment Stairs: Yes: External: 4 steps; can reach both Has following equipment at home: None  OCCUPATION: Retired  PLOF: Independent  PATIENT GOALS: Transfer and go up stairs without pain. Get back to walking and yoga.   NEXT MD VISIT: 12/11  OBJECTIVE:  Note: Objective measures were completed at Evaluation unless otherwise noted.  DIAGNOSTIC FINDINGS: Not listed  PATIENT SURVEYS:  FOTO - 54 with expected 72 (04/26/2023)  COGNITION: Overall cognitive status: Within functional limits  for tasks assessed     SENSATION: Not tested  PALPATION: Lateral joint line tenderness of R knee  LOWER EXTREMITY ROM:  Active/Passive ROM Right eval Left eval  Hip flexion    Hip extension    Hip abduction    Hip adduction    Hip internal rotation    Hip external rotation    Knee flexion 120/130 135/135  Knee extension 8/8 2/2  Ankle dorsiflexion    Ankle plantarflexion    Ankle inversion    Ankle eversion     (Blank rows = not tested)  LOWER EXTREMITY MMT:  MMT Right eval Left eval  Hip flexion 4 4  Hip extension    Hip abduction    Hip adduction    Hip internal rotation 4- 4-  Hip external rotation 4- 4-  Knee flexion  4 4  Knee extension 4+ 4+  Ankle dorsiflexion    Ankle plantarflexion    Ankle inversion    Ankle eversion     (Blank rows = not tested)  LOWER EXTREMITY SPECIAL TESTS:  Knee special tests: McMurray's test: positive , Thessaly test: positive , and Step up/down test: negative  GAIT: Not tested   TODAY'S TREATMENT:                                                                                                                              DATE:  04/26/23 FOTO - 54 with expected 72 Squats to 50 cm seat with green band above knees and 10# weight 3x10  OMEGA Single leg Leg press 3x10 @40lbs  Forward step downs on right leg with slow eccentric -  20 cm step, 2x8 Supine bridges with staggered stance 2x10 per side Monster walks with green band and 10# weight   - min cuing to land with toes first instead of heel    04/24/23 FOTO - deferred to next session Forward step downs on right leg 2x8 Leg press 3x10 @75lbs  Resisted hip ER/IR with red theraband 2x10 per side Squats to 60cm seat 2x10 Pt educated on proper form for sit to stand transfers without use of hands  - 3x5 sit to stands from 20 inch seat with min cuing for weight shift and use of momentum   04/20/23 Forward step downs 2x10 Standing hamstring curls with green TB 2x10     PATIENT EDUCATION:  Education details: Pt educated on PT findings,  Person educated: Patient Education method: Explanation Education comprehension: verbalized understanding  HOME EXERCISE PROGRAM: Access Code: [email or web address] URL: [email or web address] Date: 04/26/2023 Prepared by: Ellin Goodie  Exercises - Forward Step Down with Heel Tap and Counter Support  - 3-4 x weekly - 3 sets - 8 reps - Mini Squat with Chair  - 3-4 x weekly - 3 sets - 10 reps - Seated Hip External Rotation AROM  - 3-4 x weekly - 3 sets - 10 reps - Seated Hip Internal Rotation with Resistance  - 3-4 x weekly -  3 sets - 10 reps - Single Leg Press  - 3 x weekly  - 4 sets - 10 reps - Supine Bridge  - 3-4 x weekly - 4 sets - 10 reps  ASSESSMENT:  CLINICAL IMPRESSION: Patient presents for  treatment for acute right knee pain. Pt demonstrates improvement with knee and hip strength as evidenced by her ability to complete exercises with increased resistance and without compensation.  Pt did require minimal cueing to reduce pain while performing monster walks by landing with her toes instead of heels to allow her plantarflexors to absorb the force instead of the knee joint.  Pt would benefit from PT to address aforementioned deficits and return to walks and exercise classes.    OBJECTIVE IMPAIRMENTS: decreased ROM, decreased strength, and pain.   ACTIVITY LIMITATIONS: standing, sleeping, stairs, and transfers  PARTICIPATION LIMITATIONS: community activity and her yoga classes  PERSONAL FACTORS: Time since onset of injury/illness/exacerbation and 1 comorbidity: OA are also affecting patient's functional outcome.   REHAB POTENTIAL: Fair preexisting OA in right knee may make treatment of meniscal pain more difficult, but the pain relatively new, which is a positive prognostic indicator.  CLINICAL DECISION MAKING: Stable/uncomplicated  EVALUATION COMPLEXITY: Low   GOALS: Goals reviewed with patient? No  SHORT TERM GOALS: Target date: 05/04/2023    Patient will demonstrate independent understanding of HEP to be able effectively manage underlying MSK condition.   Baseline: Goal status: ONGOING   LONG TERM GOALS: Target date: 06/29/2023    Patient will have improved function and activity level as evidenced by an increase in FOTO score by 10 points or more.   Baseline: NOT TESTED Goal status: ONGOING  2.  Pt will increase right knee ROM to be symmetrical with the left Baseline: (R/L) Knee flexion: 120 deg Active(130 deg Passive) / 135 (A/PROM). Ext: 2 deg /8 deg (AROM=PROM) Goal status: ONGOING  3.  Pt will increase bilateral hip and knee  strength measures by 1/3 (i.e. 4 to 4+) to increase LE stability in order to better perform ADL's and activities of her exercise classes. Baseline: (R/L) Hip - flex: 4/4, ER: 4-/4-, IR: 4-/4-; Knee - flex: 4/4, ext 4+/4+ Goal status: ONGOING    PLAN:  PT FREQUENCY: 1-2x/week  PT DURATION: 10 weeks  PLANNED INTERVENTIONS: 97164- PT Re-evaluation, 97110-Therapeutic exercises, 97530- Therapeutic activity, 97112- Neuromuscular re-education, 97535- Self Care, 82956- Manual therapy, Stair training, Dry Needling, and Joint mobilization  PLAN FOR NEXT SESSION: progress knee strength exercises and hip strengthening exercises (squats only tapping chair, increasing weight of current exercises), program more single leg strength for added stability component    Arcelia Jew, Student-PT 04/20/2023, 10:18 AM

## 2023-05-01 ENCOUNTER — Ambulatory Visit (INDEPENDENT_AMBULATORY_CARE_PROVIDER_SITE_OTHER): Payer: BC Managed Care – PPO

## 2023-05-01 ENCOUNTER — Encounter: Payer: BC Managed Care – PPO | Admitting: Physical Therapy

## 2023-05-01 DIAGNOSIS — E538 Deficiency of other specified B group vitamins: Secondary | ICD-10-CM

## 2023-05-01 MED ORDER — CYANOCOBALAMIN 1000 MCG/ML IJ SOLN
1000.0000 ug | Freq: Once | INTRAMUSCULAR | Status: AC
  Administered 2023-05-01: 1000 ug via INTRAMUSCULAR

## 2023-05-01 NOTE — Progress Notes (Signed)
Patient presented for B 12 injection given by Rhylan Kagel, CMA to left deltoid, patient voiced no concerns nor showed any signs of distress during injection.  

## 2023-05-03 ENCOUNTER — Ambulatory Visit: Payer: BC Managed Care – PPO | Admitting: Physical Therapy

## 2023-05-03 DIAGNOSIS — M25561 Pain in right knee: Secondary | ICD-10-CM

## 2023-05-03 NOTE — Therapy (Addendum)
OUTPATIENT PHYSICAL THERAPY LOWER EXTREMITY DISCHARGE   Discharge Summary: Pt has since decided to discharge from physical therapy and the reason is unclear.   Patient Name: Jessica Bennett MRN: [id number] DOB:April 04, 1966, 57 y.o., female Today's Date: 05/03/2023  END OF SESSION:  PT End of Session - 05/03/23 1523     Visit Number 4    Number of Visits 20    Date for PT Re-Evaluation 06/29/23    Authorization Type BCBS State 2024    Authorization - Visit Number 4    Authorization - Number of Visits 20    Progress Note Due on Visit 10    PT Start Time 1430    PT Stop Time 1510    PT Time Calculation (min) 40 min    Activity Tolerance Patient tolerated treatment well    Behavior During Therapy WFL for tasks assessed/performed                Past Medical History:  Diagnosis Date   Anemia    pernicious anemia (b12)   B12 deficiency    Family history of ovarian cancer    Past Surgical History:  Procedure Laterality Date   BREAST BIOPSY Right 2010   benign   broken ankle  2011   broken tibia right, Dr. Gerrit Heck   HYSTEROSCOPY WITH D & C N/A 05/21/2020   Procedure: DILATATION AND CURETTAGE /HYSTEROSCOPY WITH MINERVA;  Surgeon: Conard Novak, MD;  Location: [facility] ORS;  Service: Gynecology;  Laterality: N/A;   VAGINAL DELIVERY     WISDOM TOOTH EXTRACTION     Patient Active Problem List   Diagnosis Date Noted   Endometrial polyp 05/21/2020   Meralgia paresthetica 01/27/2020   Shoulder pain 01/27/2020   Iron deficiency anemia 01/27/2020   Advance care planning 12/26/2018   Hair loss 11/28/2017   Routine general medical examination at a health care facility 10/22/2012   Pernicious anemia 12/19/2011    PCP: Joaquim Nam, MD    REFERRING PROVIDER: Hannah Beat, MD   REFERRING DIAG: [phone] (ICD-10-CM) - Acute pain of right knee   THERAPY DIAG:  Acute pain of right knee  Rationale for Evaluation and Treatment: Rehabilitation  ONSET DATE: 4  weeks ago  SUBJECTIVE:   SUBJECTIVE STATEMENT: Pt still reports feeling "great"  - 1/10 pain on NPRS today.   PERTINENT HISTORY: Has had pain on and off for several years. Has ORIF in right LE after injury 13 years ago. Pain was consistent at start but worse with prolonged extension and squatting and transfers. Has been better since celebrex. Has dx of arthritis on R knee. No Hx of rheumatoid arthritis. No specific event.  PAIN:  Are you having pain? 0/10 on NPRS currently, but can go up to a 6/10 when going up stairs, or walking but resolves within 1 minute. Relieving factors are ice. Pain located around center of knee and lateral joint line.   PRECAUTIONS: None  RED FLAGS: None   WEIGHT BEARING RESTRICTIONS: No  FALLS:  Has patient fallen in last 6 months? No  LIVING ENVIRONMENT: Lives with: lives with their family and lives alone Lives in: House/apartment Stairs: Yes: External: 4 steps; can reach both Has following equipment at home: None  OCCUPATION: Retired  PLOF: Independent  PATIENT GOALS: Transfer and go up stairs without pain. Get back to walking and yoga.   NEXT MD VISIT: 12/11  OBJECTIVE:  Note: Objective measures were completed at Evaluation unless otherwise noted.  DIAGNOSTIC FINDINGS: Not  listed  PATIENT SURVEYS:  FOTO - 54 with expected 72 (04/26/2023)  COGNITION: Overall cognitive status: Within functional limits for tasks assessed     SENSATION: Not tested  PALPATION: Lateral joint line tenderness of R knee  LOWER EXTREMITY ROM:  Active/Passive ROM Right eval Left eval  Hip flexion    Hip extension    Hip abduction    Hip adduction    Hip internal rotation    Hip external rotation    Knee flexion 120/130 135/135  Knee extension 8/8 2/2  Ankle dorsiflexion    Ankle plantarflexion    Ankle inversion    Ankle eversion     (Blank rows = not tested)  LOWER EXTREMITY MMT:  MMT Right eval Left eval  Hip flexion 4 4  Hip extension     Hip abduction    Hip adduction    Hip internal rotation 4- 4-  Hip external rotation 4- 4-  Knee flexion 4 4  Knee extension 4+ 4+  Ankle dorsiflexion    Ankle plantarflexion    Ankle inversion    Ankle eversion     (Blank rows = not tested)  LOWER EXTREMITY SPECIAL TESTS:  Knee special tests: McMurray's test: positive , Thessaly test: positive , and Step up/down test: negative  GAIT: Not tested   TODAY'S TREATMENT:                                                                                                                              DATE:  05/03/23 NuStep seat 10 level 3 for 6 min Squats with taps to 65 cm seat with blue band above knees and 10# weight 3x6  OMEGA Single leg Leg press 3x10 @ 75lbs Blaze pod with blue abduction band 3x30s Sled push/pull 3x34m @170lb  Supine bridges with staggered stance 2x10 per side   04/26/23 FOTO - 54 with expected 72 Squats to 50 cm seat with green band above knees and 10# weight 3x10  OMEGA Single leg Leg press 3x10 @40lbs  Forward step downs on right leg with slow eccentric -  20 cm step, 2x8 Supine bridges with staggered stance 2x10 per side Monster walks with green band and 10# weight   - min cuing to land with toes first instead of heel  04/24/23 FOTO - deferred to next session Forward step downs on right leg 2x8 Leg press 3x10 @75lbs  Resisted hip ER/IR with red theraband 2x10 per side Squats to 60cm seat 2x10 Pt educated on proper form for sit to stand transfers without use of hands  - 3x5 sit to stands from 20 inch seat with min cuing for weight shift and use of momentum   04/20/23 Forward step downs 2x10 Standing hamstring curls with green TB 2x10     PATIENT EDUCATION:  Education details: Pt educated on PT findings,  Person educated: Patient Education method: Explanation Education comprehension: verbalized understanding  HOME EXERCISE PROGRAM: Access Code: [email or web address] URL:  [email or web address]  Date: 05/03/2023 Prepared by: Ellin Goodie  Exercises - Forward Step Down with Heel Tap and Counter Support  - 3-4 x weekly - 3 sets - 8 reps - Mini Squat with Chair  - 3-4 x weekly - 3 sets - 10 reps - Seated Hip External Rotation AROM  - 3-4 x weekly - 3 sets - 10 reps - Seated Hip Internal Rotation with Resistance  - 3-4 x weekly - 3 sets - 10 reps - Single Leg Press  - 3 x weekly - 4 sets - 10 reps - Supine Bridge  - 3-4 x weekly - 4 sets - 10 reps  ASSESSMENT:  CLINICAL IMPRESSION: Discharge Update: Pt has since decided to discharge from PT with the reason being unclear. She was progressing towards rehab goals, but she has not yet her rehab goals.    Patient presents for  treatment for acute right knee pain. Pt focused on knee and hip strength and stability. Pt demonstrates progression towards goals with improvement with knee and hip strength as evidenced by her ability to complete LE exercises with increased resistance and without compensation. She was also able to complete strength exercise with added reactionary component. Pt would benefit from PT to address aforementioned deficits and return to walks and exercise classes.    OBJECTIVE IMPAIRMENTS: decreased ROM, decreased strength, and pain.   ACTIVITY LIMITATIONS: standing, sleeping, stairs, and transfers  PARTICIPATION LIMITATIONS: community activity and her yoga classes  PERSONAL FACTORS: Time since onset of injury/illness/exacerbation and 1 comorbidity: OA are also affecting patient's functional outcome.   REHAB POTENTIAL: Fair preexisting OA in right knee may make treatment of meniscal pain more difficult, but the pain relatively new, which is a positive prognostic indicator.  CLINICAL DECISION MAKING: Stable/uncomplicated  EVALUATION COMPLEXITY: Low   GOALS: Goals reviewed with patient? No  SHORT TERM GOALS: Target date: 05/04/2023    Patient will demonstrate independent  understanding of HEP to be able effectively manage underlying MSK condition.   Baseline: Goal status: ONGOING   LONG TERM GOALS: Target date: 06/29/2023    Patient will have improved function and activity level as evidenced by an increase in FOTO score by 10 points or more.   Baseline: NOT TESTED Goal status: Deferred   2.  Pt will increase right knee ROM to be symmetrical with the left Baseline: (R/L) Knee flexion: 120 deg Active(130 deg Passive) / 135 (A/PROM). Ext: 2 deg /8 deg (AROM=PROM) Goal status: NOT MET   3.  Pt will increase bilateral hip and knee strength measures by 1/3 (i.e. 4 to 4+) to increase LE stability in order to better perform ADL's and activities of her exercise classes. Baseline: (R/L) Hip - flex: 4/4, ER: 4-/4-, IR: 4-/4-; Knee - flex: 4/4, ext 4+/4+ Goal status: NOT MET    PLAN:  PT FREQUENCY: 1-2x/week  PT DURATION: 10 weeks  PLANNED INTERVENTIONS: 97164- PT Re-evaluation, 97110-Therapeutic exercises, 97530- Therapeutic activity, 97112- Neuromuscular re-education, 97535- Self Care, 91478- Manual therapy, Stair training, Dry Needling, and Joint mobilization  PLAN FOR NEXT SESSION: Discharge from PT.   Arcelia Jew, Student-PT 04/20/2023, 10:18 AM

## 2023-05-04 ENCOUNTER — Encounter: Payer: BC Managed Care – PPO | Admitting: Physical Therapy

## 2023-05-14 ENCOUNTER — Other Ambulatory Visit: Payer: Self-pay | Admitting: Family Medicine

## 2023-05-14 DIAGNOSIS — E538 Deficiency of other specified B group vitamins: Secondary | ICD-10-CM

## 2023-05-14 DIAGNOSIS — D510 Vitamin B12 deficiency anemia due to intrinsic factor deficiency: Secondary | ICD-10-CM

## 2023-05-16 ENCOUNTER — Encounter: Payer: BC Managed Care – PPO | Admitting: Physical Therapy

## 2023-05-18 ENCOUNTER — Ambulatory Visit: Payer: Self-pay | Admitting: Physical Therapy

## 2023-05-23 ENCOUNTER — Encounter: Payer: BC Managed Care – PPO | Admitting: Physical Therapy

## 2023-05-23 NOTE — Progress Notes (Unsigned)
    Ruslan Mccabe T. Makael Stein, MD, CAQ Sports Medicine [facility] [address] [place], [address]  Phone: [phone]  FAX: [phone]  Jessica Bennett - 57 y.o. female  MRN [id number]  Date of Birth: 27-Dec-1965  Date: 05/25/2023  PCP: Joaquim Nam, MD  Referral: Joaquim Nam, MD  No chief complaint on file.  Subjective:   Jessica Bennett is a 57 y.o. very pleasant female patient with There is no height or weight on file to calculate BMI. who presents with the following:  Pleasant patient who presents with some ongoing right-sided knee pain.  I saw her on April 12, 2023, at that point she did have some mild knee arthritis on the right side, and I gave her some Celebrex to start as well as doing some formal physical therapy.    Review of Systems is noted in the HPI, as appropriate  Objective:   There were no vitals taken for this visit.  GEN: No acute distress; alert,appropriate. PULM: Breathing comfortably in no respiratory distress PSYCH: Normally interactive.   Laboratory and Imaging Data:  Assessment and Plan:   ***

## 2023-05-24 ENCOUNTER — Other Ambulatory Visit (INDEPENDENT_AMBULATORY_CARE_PROVIDER_SITE_OTHER): Payer: BC Managed Care – PPO

## 2023-05-24 DIAGNOSIS — D510 Vitamin B12 deficiency anemia due to intrinsic factor deficiency: Secondary | ICD-10-CM

## 2023-05-24 DIAGNOSIS — E538 Deficiency of other specified B group vitamins: Secondary | ICD-10-CM

## 2023-05-24 LAB — CBC WITH DIFFERENTIAL/PLATELET
Basophils Absolute: 0.1 10*3/uL (ref 0.0–0.1)
Basophils Relative: 0.9 % (ref 0.0–3.0)
Eosinophils Absolute: 0.2 10*3/uL (ref 0.0–0.7)
Eosinophils Relative: 2.7 % (ref 0.0–5.0)
HCT: 41.8 % (ref 36.0–46.0)
Hemoglobin: 13.8 g/dL (ref 12.0–15.0)
Lymphocytes Relative: 35.4 % (ref 12.0–46.0)
Lymphs Abs: 2 10*3/uL (ref 0.7–4.0)
MCHC: 33.1 g/dL (ref 30.0–36.0)
MCV: 89 fL (ref 78.0–100.0)
Monocytes Absolute: 0.3 10*3/uL (ref 0.1–1.0)
Monocytes Relative: 5.3 % (ref 3.0–12.0)
Neutro Abs: 3.2 10*3/uL (ref 1.4–7.7)
Neutrophils Relative %: 55.7 % (ref 43.0–77.0)
Platelets: 317 10*3/uL (ref 150.0–400.0)
RBC: 4.7 Mil/uL (ref 3.87–5.11)
RDW: 13 % (ref 11.5–15.5)
WBC: 5.7 10*3/uL (ref 4.0–10.5)

## 2023-05-24 LAB — COMPREHENSIVE METABOLIC PANEL
ALT: 9 U/L (ref 0–35)
AST: 15 U/L (ref 0–37)
Albumin: 4.3 g/dL (ref 3.5–5.2)
Alkaline Phosphatase: 59 U/L (ref 39–117)
BUN: 15 mg/dL (ref 6–23)
CO2: 30 meq/L (ref 19–32)
Calcium: 9.2 mg/dL (ref 8.4–10.5)
Chloride: 105 meq/L (ref 96–112)
Creatinine, Ser: 0.84 mg/dL (ref 0.40–1.20)
GFR: 77.01 mL/min (ref >60.00)
Glucose, Bld: 86 mg/dL (ref 70–99)
Potassium: 3.7 meq/L (ref 3.5–5.1)
Sodium: 143 meq/L (ref 135–145)
Total Bilirubin: 0.6 mg/dL (ref 0.2–1.2)
Total Protein: 6.7 g/dL (ref 6.0–8.3)

## 2023-05-24 LAB — VITAMIN B12: Vitamin B-12: 326 pg/mL (ref 211–911)

## 2023-05-24 LAB — IRON: Iron: 81 ug/dL (ref 42–145)

## 2023-05-25 ENCOUNTER — Encounter: Payer: BC Managed Care – PPO | Admitting: Physical Therapy

## 2023-05-25 ENCOUNTER — Other Ambulatory Visit: Payer: BC Managed Care – PPO

## 2023-05-25 ENCOUNTER — Ambulatory Visit: Payer: BC Managed Care – PPO | Admitting: Family Medicine

## 2023-05-25 VITALS — BP 110/70 | HR 74 | Temp 97.8°F | Ht 69.5 in | Wt 251.0 lb

## 2023-05-25 DIAGNOSIS — M25561 Pain in right knee: Secondary | ICD-10-CM

## 2023-05-25 DIAGNOSIS — M1711 Unilateral primary osteoarthritis, right knee: Secondary | ICD-10-CM | POA: Diagnosis not present

## 2023-05-29 ENCOUNTER — Encounter: Payer: BC Managed Care – PPO | Admitting: Physical Therapy

## 2023-05-31 ENCOUNTER — Encounter: Payer: BC Managed Care – PPO | Admitting: Physical Therapy

## 2023-06-01 ENCOUNTER — Encounter: Payer: BC Managed Care – PPO | Admitting: Family Medicine

## 2023-06-05 ENCOUNTER — Other Ambulatory Visit: Payer: Self-pay | Admitting: Family Medicine

## 2023-06-05 DIAGNOSIS — Z1231 Encounter for screening mammogram for malignant neoplasm of breast: Secondary | ICD-10-CM

## 2023-06-12 ENCOUNTER — Encounter: Payer: BC Managed Care – PPO | Admitting: Physical Therapy

## 2023-06-13 ENCOUNTER — Encounter: Payer: BC Managed Care – PPO | Admitting: Physical Therapy

## 2023-06-15 ENCOUNTER — Ambulatory Visit (INDEPENDENT_AMBULATORY_CARE_PROVIDER_SITE_OTHER): Payer: 59

## 2023-06-15 DIAGNOSIS — E538 Deficiency of other specified B group vitamins: Secondary | ICD-10-CM | POA: Diagnosis not present

## 2023-06-15 MED ORDER — CYANOCOBALAMIN 1000 MCG/ML IJ SOLN
1000.0000 ug | Freq: Once | INTRAMUSCULAR | Status: AC
  Administered 2023-06-15: 1000 ug via INTRAMUSCULAR

## 2023-06-15 NOTE — Progress Notes (Signed)
 Per orders of Dr. Crawford Givens, injection of B-12 given by Eual Fines, CMA in right deltoid. Patient tolerated injection well. Patient will make appointment for 1 month.

## 2023-06-22 ENCOUNTER — Ambulatory Visit (INDEPENDENT_AMBULATORY_CARE_PROVIDER_SITE_OTHER): Payer: 59 | Admitting: Family Medicine

## 2023-06-22 ENCOUNTER — Encounter: Payer: Self-pay | Admitting: Family Medicine

## 2023-06-22 VITALS — BP 118/72 | HR 90 | Temp 97.9°F | Ht 69.25 in | Wt 241.0 lb

## 2023-06-22 DIAGNOSIS — Z0000 Encounter for general adult medical examination without abnormal findings: Secondary | ICD-10-CM | POA: Diagnosis not present

## 2023-06-22 DIAGNOSIS — Z8742 Personal history of other diseases of the female genital tract: Secondary | ICD-10-CM

## 2023-06-22 DIAGNOSIS — F419 Anxiety disorder, unspecified: Secondary | ICD-10-CM

## 2023-06-22 DIAGNOSIS — M25569 Pain in unspecified knee: Secondary | ICD-10-CM

## 2023-06-22 DIAGNOSIS — Z7189 Other specified counseling: Secondary | ICD-10-CM

## 2023-06-22 DIAGNOSIS — D510 Vitamin B12 deficiency anemia due to intrinsic factor deficiency: Secondary | ICD-10-CM

## 2023-06-22 MED ORDER — CELECOXIB 200 MG PO CAPS: 200.0000 mg | ORAL_CAPSULE | Freq: Every day | ORAL | 2 refills | Status: AC | PRN

## 2023-06-22 MED ORDER — ESCITALOPRAM OXALATE 10 MG PO TABS: 10.0000 mg | ORAL_TABLET | Freq: Every day | ORAL | 3 refills | Status: AC

## 2023-06-22 MED ORDER — CYANOCOBALAMIN 1000 MCG/ML IJ SOLN: INTRAMUSCULAR | Status: AC
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
## 2023-06-22 NOTE — Patient Instructions (Signed)
 Start lexapro 10mg  a day and update me in about 3-4 weeks, sooner if needed.  Take care.  Glad to see you.

## 2023-06-25 DIAGNOSIS — F419 Anxiety disorder, unspecified: Secondary | ICD-10-CM | POA: Insufficient documentation

## 2023-06-25 DIAGNOSIS — M25569 Pain in unspecified knee: Secondary | ICD-10-CM | POA: Insufficient documentation

## 2023-06-25 NOTE — Assessment & Plan Note (Signed)
 H/o B12 def. Continue replacement as is.

## 2023-06-25 NOTE — Assessment & Plan Note (Signed)
Continue prn celebrex.

## 2023-06-25 NOTE — Assessment & Plan Note (Signed)
Living will d/w pt.  Would have her daughter designated if patient were incapacitated.   

## 2023-06-25 NOTE — Assessment & Plan Note (Signed)
 Okay for outpatient f/u.  Start lexapro 10mg  a day and update me in about 3-4 weeks, sooner if needed. Continue with exercise, she has social support.

## 2023-06-25 NOTE — Assessment & Plan Note (Signed)
 Tetanus 2023 Flu done yearly PNA d/w pt.   Shingles prev done.   covid vaccine 2021 Pap not due, d/w pt.   Mammogram 2024 DXA not due, d/w pt.  Colonoscopy 04/21/22 at [facility] will d/w pt.  Would have her daughter designated if patient were incapacitated.   Diet and exercise d/w pt.   HIV screening prev done.  Reasonable to defer HCV screening as she is low risk, d/w pt.

## 2023-07-08 ENCOUNTER — Other Ambulatory Visit: Payer: Self-pay | Admitting: Family Medicine

## 2023-07-18 ENCOUNTER — Ambulatory Visit (INDEPENDENT_AMBULATORY_CARE_PROVIDER_SITE_OTHER): Payer: 59

## 2023-07-18 DIAGNOSIS — E538 Deficiency of other specified B group vitamins: Secondary | ICD-10-CM

## 2023-07-18 MED ORDER — CYANOCOBALAMIN 1000 MCG/ML IJ SOLN
1000.0000 ug | Freq: Once | INTRAMUSCULAR | Status: AC
  Administered 2023-07-18: 1000 ug via INTRAMUSCULAR

## 2023-07-18 NOTE — Progress Notes (Signed)
Per orders of Dr. Para March, injection of monthly B12 1000 mcg/ml given by Eual Fines, CMA in Left Deltoid. Patient tolerated injection well.  Sent to Dr Sharen Hones in Dr Lianne Bushy absence.

## 2023-08-07 ENCOUNTER — Ambulatory Visit
Admission: RE | Admit: 2023-08-07 | Discharge: 2023-08-07 | Disposition: A | Discharge status: Home / Self Care | Payer: 59 | Source: Ambulatory Visit | Attending: Family Medicine | Admitting: Family Medicine

## 2023-08-07 DIAGNOSIS — Z1231 Encounter for screening mammogram for malignant neoplasm of breast: Secondary | ICD-10-CM | POA: Insufficient documentation

## 2023-08-08 ENCOUNTER — Encounter: Payer: Self-pay | Admitting: Family Medicine

## 2023-08-15 ENCOUNTER — Ambulatory Visit (INDEPENDENT_AMBULATORY_CARE_PROVIDER_SITE_OTHER): Payer: 59

## 2023-08-15 DIAGNOSIS — E538 Deficiency of other specified B group vitamins: Secondary | ICD-10-CM

## 2023-08-15 MED ORDER — CYANOCOBALAMIN 1000 MCG/ML IJ SOLN
1000.0000 ug | Freq: Once | INTRAMUSCULAR | Status: AC
  Administered 2023-08-15: 1000 ug via INTRAMUSCULAR

## 2023-08-15 NOTE — Progress Notes (Signed)
Per orders of Dr. Graham Duncan, injection of B-12 given by Ronique Simerly C Nayomi Tabron in right deltoid. Patient tolerated injection well. Patient will make appointment for 1 month.  

## 2023-10-03 ENCOUNTER — Ambulatory Visit (INDEPENDENT_AMBULATORY_CARE_PROVIDER_SITE_OTHER)

## 2023-10-03 DIAGNOSIS — E538 Deficiency of other specified B group vitamins: Secondary | ICD-10-CM | POA: Diagnosis not present

## 2023-10-03 MED ORDER — CYANOCOBALAMIN 1000 MCG/ML IJ SOLN
1000.0000 ug | Freq: Once | INTRAMUSCULAR | Status: AC
  Administered 2023-10-03: 1000 ug via INTRAMUSCULAR

## 2023-10-03 NOTE — Progress Notes (Signed)
Per orders of Dr. Duncan, injection of monthly B12 1000 mcg/ml given by Shynice Sigel P Annica Marinello, CMA in Left Deltoid. Patient tolerated injection well.  

## 2023-11-07 ENCOUNTER — Ambulatory Visit (INDEPENDENT_AMBULATORY_CARE_PROVIDER_SITE_OTHER): Admitting: *Deleted

## 2023-11-07 DIAGNOSIS — E538 Deficiency of other specified B group vitamins: Secondary | ICD-10-CM

## 2023-11-07 MED ORDER — CYANOCOBALAMIN 1000 MCG/ML IJ SOLN
1000.0000 ug | Freq: Once | INTRAMUSCULAR | Status: AC
  Administered 2023-11-07: 1000 ug via INTRAMUSCULAR

## 2023-11-07 NOTE — Progress Notes (Signed)
Per orders of Dr. Crawford Givens, injection of B12 given by Blenda Mounts M in right deltoid. Patient tolerated injection well. Patient will make appointment for 1 month.

## 2023-12-12 ENCOUNTER — Ambulatory Visit

## 2023-12-12 DIAGNOSIS — E538 Deficiency of other specified B group vitamins: Secondary | ICD-10-CM | POA: Diagnosis not present

## 2023-12-12 MED ORDER — CYANOCOBALAMIN 1000 MCG/ML IJ SOLN
1000.0000 ug | Freq: Once | INTRAMUSCULAR | Status: AC
  Administered 2023-12-12: 1000 ug via INTRAMUSCULAR

## 2023-12-12 NOTE — Progress Notes (Signed)
 Per orders of Dr. Cleatus, injection of monthly B12 1000 mcg/ml given by Clotilda SHAUNNA Pander, CMA in Left Deltoid. Patient tolerated injection well. Scheduled for next month.

## 2024-01-16 ENCOUNTER — Ambulatory Visit (INDEPENDENT_AMBULATORY_CARE_PROVIDER_SITE_OTHER)

## 2024-01-16 DIAGNOSIS — E538 Deficiency of other specified B group vitamins: Secondary | ICD-10-CM | POA: Diagnosis not present

## 2024-01-16 MED ORDER — CYANOCOBALAMIN 1000 MCG/ML IJ SOLN
1000.0000 ug | Freq: Once | INTRAMUSCULAR | Status: AC
  Administered 2024-01-16: 1000 ug via INTRAMUSCULAR

## 2024-01-16 NOTE — Progress Notes (Signed)
Per orders of Dr. Crawford Givens, injection of B-12 given by Melina Copa in right deltoid. Patient tolerated injection well. Patient will make appointment for 1 month.

## 2024-02-20 ENCOUNTER — Ambulatory Visit (INDEPENDENT_AMBULATORY_CARE_PROVIDER_SITE_OTHER)

## 2024-02-20 DIAGNOSIS — E538 Deficiency of other specified B group vitamins: Secondary | ICD-10-CM

## 2024-02-20 MED ORDER — CYANOCOBALAMIN 1000 MCG/ML IJ SOLN
1000.0000 ug | Freq: Once | INTRAMUSCULAR | Status: AC
  Administered 2024-02-20: 1000 ug via INTRAMUSCULAR

## 2024-02-20 NOTE — Progress Notes (Signed)
Per orders of Dr. Crawford Givens, injection of B-12 given by Melina Copa in left deltoid. Patient tolerated injection well. Patient will make appointment for 1 month.

## 2024-03-26 ENCOUNTER — Ambulatory Visit

## 2024-03-26 DIAGNOSIS — E538 Deficiency of other specified B group vitamins: Secondary | ICD-10-CM

## 2024-03-26 MED ORDER — CYANOCOBALAMIN 1000 MCG/ML IJ SOLN
1000.0000 ug | Freq: Once | INTRAMUSCULAR | Status: AC
  Administered 2024-03-26: 1000 ug via INTRAMUSCULAR

## 2024-03-26 NOTE — Progress Notes (Signed)
Per orders of Dr. Crawford Givens, injection of B-12 given by Melina Copa in right deltoid. Patient tolerated injection well. Patient will make appointment for 1 month.

## 2024-04-30 ENCOUNTER — Ambulatory Visit (INDEPENDENT_AMBULATORY_CARE_PROVIDER_SITE_OTHER): Admitting: *Deleted

## 2024-04-30 DIAGNOSIS — E538 Deficiency of other specified B group vitamins: Secondary | ICD-10-CM

## 2024-04-30 MED ORDER — CYANOCOBALAMIN 1000 MCG/ML IJ SOLN: 1000.0000 ug | Freq: Once | INTRAMUSCULAR | Status: DC

## 2024-04-30 MED ORDER — CYANOCOBALAMIN 1000 MCG/ML IJ SOLN
1000.0000 ug | Freq: Once | INTRAMUSCULAR | Status: AC
  Administered 2024-04-30: 1000 ug via INTRAMUSCULAR

## 2024-04-30 NOTE — Progress Notes (Signed)
 Per orders of Arlyss Solian, MD injection of Vitamin B12 given by Manuelita JAYSON Frost in right. Patient tolerated injection well.

## 2024-04-30 NOTE — Progress Notes (Signed)
Per orders of Dr. Crawford Givens, injection of B-12 given by Melina Copa in left deltoid. Patient tolerated injection well. Patient will make appointment for 1 month.

## 2024-05-30 ENCOUNTER — Ambulatory Visit

## 2024-05-30 DIAGNOSIS — E538 Deficiency of other specified B group vitamins: Secondary | ICD-10-CM

## 2024-05-30 MED ORDER — CYANOCOBALAMIN 1000 MCG/ML IJ SOLN
1000.0000 ug | Freq: Once | INTRAMUSCULAR | Status: AC
  Administered 2024-05-30: 14:00:00 1000 ug via INTRAMUSCULAR

## 2024-05-30 NOTE — Progress Notes (Signed)
 Per orders of Dr. Arlyss Solian, injection of B-12 given by Rayleen Forts M in Right deltoid Patient tolerated injection well. Patient will make appointment for 1 month(s)

## 2024-06-27 ENCOUNTER — Other Ambulatory Visit: Payer: Self-pay | Admitting: Family Medicine

## 2024-06-27 DIAGNOSIS — Z1231 Encounter for screening mammogram for malignant neoplasm of breast: Secondary | ICD-10-CM

## 2024-07-09 ENCOUNTER — Ambulatory Visit

## 2024-07-17 ENCOUNTER — Ambulatory Visit

## 2024-07-17 DIAGNOSIS — E538 Deficiency of other specified B group vitamins: Secondary | ICD-10-CM

## 2024-07-17 MED ORDER — CYANOCOBALAMIN 1000 MCG/ML IJ SOLN: 1000.0000 ug | Freq: Once | INTRAMUSCULAR | Status: AC

## 2024-07-17 NOTE — Progress Notes (Signed)
 Per orders of Dr. Arlyss Solian, injection of B-12 given by Delores, Basha Krygier D in Left deltoid Patient tolerated injection well. Patient will make appointment for 1 month(s)

## 2024-08-07 ENCOUNTER — Encounter

## 2024-08-15 ENCOUNTER — Encounter: Admitting: Family Medicine

## 2024-08-20 ENCOUNTER — Ambulatory Visit
